# Patient Record
Sex: Female | Born: 1950 | Race: White | Hispanic: No | Marital: Married | Smoking: Former smoker
Health system: Southern US, Community
[De-identification: ages and names within clinical notes are randomized; demographics above are authoritative.]

## PROBLEM LIST (undated history)

## (undated) DIAGNOSIS — B029 Zoster without complications: Secondary | ICD-10-CM

## (undated) DIAGNOSIS — E119 Type 2 diabetes mellitus without complications: Secondary | ICD-10-CM

## (undated) DIAGNOSIS — B019 Varicella without complication: Secondary | ICD-10-CM

## (undated) DIAGNOSIS — R011 Cardiac murmur, unspecified: Secondary | ICD-10-CM

## (undated) DIAGNOSIS — E079 Disorder of thyroid, unspecified: Secondary | ICD-10-CM

## (undated) DIAGNOSIS — N390 Urinary tract infection, site not specified: Secondary | ICD-10-CM

## (undated) DIAGNOSIS — K635 Polyp of colon: Secondary | ICD-10-CM

## (undated) HISTORY — PX: TUBAL LIGATION: SHX77

## (undated) HISTORY — DX: Zoster without complications: B02.9

## (undated) HISTORY — DX: Urinary tract infection, site not specified: N39.0

## (undated) HISTORY — DX: Varicella without complication: B01.9

## (undated) HISTORY — DX: Type 2 diabetes mellitus without complications: E11.9

## (undated) HISTORY — DX: Cardiac murmur, unspecified: R01.1

## (undated) HISTORY — DX: Disorder of thyroid, unspecified: E07.9

## (undated) HISTORY — DX: Polyp of colon: K63.5

---

## 2011-08-23 HISTORY — PX: COLONOSCOPY: SHX174

## 2013-02-19 ENCOUNTER — Ambulatory Visit: Payer: Self-pay | Admitting: Internal Medicine

## 2013-03-04 ENCOUNTER — Encounter: Payer: Self-pay | Admitting: Internal Medicine

## 2013-03-04 ENCOUNTER — Ambulatory Visit (INDEPENDENT_AMBULATORY_CARE_PROVIDER_SITE_OTHER): Payer: BC Managed Care – PPO | Admitting: Internal Medicine

## 2013-03-04 VITALS — BP 122/70 | HR 72 | Temp 98.6°F | Resp 14 | Ht 66.0 in | Wt 153.8 lb

## 2013-03-04 DIAGNOSIS — R5381 Other malaise: Secondary | ICD-10-CM

## 2013-03-04 DIAGNOSIS — R918 Other nonspecific abnormal finding of lung field: Secondary | ICD-10-CM

## 2013-03-04 DIAGNOSIS — E785 Hyperlipidemia, unspecified: Secondary | ICD-10-CM | POA: Insufficient documentation

## 2013-03-04 DIAGNOSIS — IMO0002 Reserved for concepts with insufficient information to code with codable children: Secondary | ICD-10-CM

## 2013-03-04 DIAGNOSIS — F411 Generalized anxiety disorder: Secondary | ICD-10-CM | POA: Insufficient documentation

## 2013-03-04 DIAGNOSIS — R5383 Other fatigue: Secondary | ICD-10-CM

## 2013-03-04 DIAGNOSIS — G4700 Insomnia, unspecified: Secondary | ICD-10-CM

## 2013-03-04 DIAGNOSIS — K769 Liver disease, unspecified: Secondary | ICD-10-CM

## 2013-03-04 DIAGNOSIS — Z720 Tobacco use: Secondary | ICD-10-CM

## 2013-03-04 DIAGNOSIS — R160 Hepatomegaly, not elsewhere classified: Secondary | ICD-10-CM

## 2013-03-04 DIAGNOSIS — Z87891 Personal history of nicotine dependence: Secondary | ICD-10-CM | POA: Insufficient documentation

## 2013-03-04 DIAGNOSIS — Z1239 Encounter for other screening for malignant neoplasm of breast: Secondary | ICD-10-CM

## 2013-03-04 DIAGNOSIS — E119 Type 2 diabetes mellitus without complications: Secondary | ICD-10-CM

## 2013-03-04 DIAGNOSIS — Z87898 Personal history of other specified conditions: Secondary | ICD-10-CM | POA: Insufficient documentation

## 2013-03-04 DIAGNOSIS — E559 Vitamin D deficiency, unspecified: Secondary | ICD-10-CM

## 2013-03-04 DIAGNOSIS — E118 Type 2 diabetes mellitus with unspecified complications: Secondary | ICD-10-CM

## 2013-03-04 DIAGNOSIS — F17200 Nicotine dependence, unspecified, uncomplicated: Secondary | ICD-10-CM

## 2013-03-04 DIAGNOSIS — Z8709 Personal history of other diseases of the respiratory system: Secondary | ICD-10-CM

## 2013-03-04 LAB — CBC WITH DIFFERENTIAL/PLATELET
Basophils Absolute: 0 10*3/uL (ref 0.0–0.1)
Eosinophils Absolute: 0.3 10*3/uL (ref 0.0–0.7)
HCT: 42.8 % (ref 36.0–46.0)
Lymphs Abs: 2.3 10*3/uL (ref 0.7–4.0)
MCHC: 33.7 g/dL (ref 30.0–36.0)
MCV: 94.7 fl (ref 78.0–100.0)
Monocytes Absolute: 0.4 10*3/uL (ref 0.1–1.0)
Platelets: 264 10*3/uL (ref 150.0–400.0)
RDW: 13.5 % (ref 11.5–14.6)

## 2013-03-04 LAB — COMPREHENSIVE METABOLIC PANEL
Albumin: 4.1 g/dL (ref 3.5–5.2)
CO2: 27 mEq/L (ref 19–32)
Calcium: 9.4 mg/dL (ref 8.4–10.5)
GFR: 82.03 mL/min (ref >60.00)
Glucose, Bld: 108 mg/dL — ABNORMAL HIGH (ref 70–99)
Potassium: 4.2 mEq/L (ref 3.5–5.1)
Sodium: 140 mEq/L (ref 135–145)
Total Bilirubin: 0.5 mg/dL (ref 0.3–1.2)
Total Protein: 8.1 g/dL (ref 6.0–8.3)

## 2013-03-04 LAB — TSH: TSH: 2.98 u[IU]/mL (ref 0.35–5.50)

## 2013-03-04 LAB — LIPID PANEL
HDL: 68 mg/dL (ref >39.00)
Triglycerides: 135 mg/dL (ref 0.0–149.0)

## 2013-03-04 LAB — HEMOGLOBIN A1C: Hgb A1c MFr Bld: 6.3 % (ref 4.6–6.5)

## 2013-03-04 LAB — LDL CHOLESTEROL, DIRECT: Direct LDL: 186.8 mg/dL

## 2013-03-04 MED ORDER — ALPRAZOLAM 0.25 MG PO TABS: 0.2500 mg | ORAL_TABLET | Freq: Every evening | ORAL | Status: DC | PRN

## 2013-03-04 MED ORDER — ATORVASTATIN CALCIUM 20 MG PO TABS: 20.0000 mg | ORAL_TABLET | Freq: Every day | ORAL | Status: DC

## 2013-03-04 NOTE — Assessment & Plan Note (Addendum)
Well-controlled on diet alone by current hemoglobin A1c of 6.4  .  She is behind on eye exams and her foot exam is normal, as is he GFT amd urinalysis.  She has untreated hyperlipidemia  And tobacco abuse.  Will recommend crestor  Or lipitor.

## 2013-03-04 NOTE — Assessment & Plan Note (Signed)
Found previously on MRI of lungs, also noted in liver. . Repeat MRI is due.

## 2013-03-04 NOTE — Progress Notes (Signed)
Patient ID: Amanda Everett, female   DOB: 1950/09/16, 62 y.o.   MRN: [id number]   Patient Active Problem List   Diagnosis Date Noted  . H/O multiple pulmonary nodules 03/04/2013  . Diabetes mellitus type 2, diet-controlled 03/04/2013  . Tobacco abuse disorder 03/04/2013  . Other and unspecified hyperlipidemia 03/04/2013    Subjective:  CC:   Chief Complaint  Patient presents with  . Establish Care    HPI:   Amanda Everett is a 62 y.o. female who presents as a new patient to establish primary care with the chief complaint of  Anxiety.  She is worried  about her son Amanda Everett, who has a history of astrocytoma with prior radiation and surgeries due to recurrence and is due for repeat MRI this Friday (We discussed his prognosis,  She has not told her son that this prognosis is incurable if the tumor has returned.0   We discussed a neurosurgical referral to [place] for [facility].  Had a full  PE last year with mammogram and PAP smear. 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  No prior abnormals.  Last colonscopy was last ar and was told to follow up in 5 yr for polyp and FH in father.   History of diet controlled DM,  Managed with 30 lb wt loss and diet and 45 minutes of treadmill daily.  Has not exercised since she moved down her from [place] last year.  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  Tobacco abuse :  Trying to quit,  Using nicotine patches at work .  Had bronchitis in 2013 unresolving,  Had MRI of lungs which showed nodules in lungs and liver.  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  Had TB testing which was negative PPD  History of septicemia in 80's after a dental cleaning,  MV prolapse but no subsequent history of endocarditis.   5 vaginal deliveries. Incompetent cervix,  Some stress incontinence.  Wears a pad.   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 Chicken pox   . Diabetes mellitus without complication   . Heart murmur     MVP slight  . Colon polyps   . Thyroid disease   . UTI (urinary tract infection)     HX    Past Surgical History  Procedure Laterality Date  . Tubal ligation      Family History  Problem Relation Age of Onset  . Stroke Mother     During surgery  . Cancer Mother 25    hodgkins  . Alcohol abuse Father   . Cancer Father 46    colon cancer 19 prostate  . Arthritis Maternal Grandmother   . Heart disease Maternal Grandmother     History   Social History  . Marital Status: Married    Spouse Name: N/A    Number of Children: N/A  . Years of Education: N/A   Occupational History  . Not on file.   Social History Main Topics  . Smoking status: Current Some Day Smoker -- 0.50 packs/day    Types: Cigarettes  . Smokeless tobacco: Never Used  . Alcohol Use: 0.6 oz/week    1 Glasses of wine  per week     Comment: rarely  . Drug Use: No  . Sexually Active: No   Other Topics Concern  . Not on file   Social History Narrative  . No narrative on file    Allergies  Allergen Reactions  . Ivp Dye (Iodinated Diagnostic Agents) Anaphylaxis  . Tetracyclines & Related Anaphylaxis     Review of Systems:   Patient denies headache, fevers, malaise, unintentional weight loss, skin rash, eye pain, sinus congestion and sinus pain, sore throat, dysphagia,  hemoptysis , , wheezing, chest pain, palpitations, orthopnea, edema, abdominal pain, nausea, melena, diarrhea, constipation, flank pain, dysuria, hematuria, urinary  Frequency, nocturia, numbness, tingling, seizures,  Focal weakness, Loss of consciousness,  Tremor, insomnia, depression, and suicidal ideation.    Objective:  BP 122/70  Pulse 72  Temp(Src) 98.6 F (37 C) (Oral)  Resp 14  Ht 5\' 6"  (1.676 m)  Wt 153 lb 12 oz (69.741 kg)  BMI 24.83  kg/m2  SpO2 98%  General appearance: alert, cooperative and appears stated age Ears: normal TM's and external ear canals both ears Throat: lips, mucosa, and tongue normal; teeth and gums normal Neck: no adenopathy, no carotid bruit, supple, symmetrical, trachea midline and thyroid not enlarged, symmetric, no tenderness/mass/nodules Back: symmetric, no curvature. ROM normal. No CVA tenderness. Lungs: clear to auscultation bilaterally Heart: regular rate and rhythm, S1, S2 normal, no murmur, click, rub or gallop Abdomen: soft, non-tender; bowel sounds normal; no masses,  no organomegaly Pulses: 2+ and symmetric Skin: Skin color, texture, turgor normal. No rashes or lesions Lymph nodes: Cervical, supraclavicular, and axillary nodes normal. Foot exam:  Nails are well trimmed,  No callouses,  Sensation intact to microfilament  Assessment and Plan:  H/O multiple pulmonary nodules Found previously on MRI of lungs, also noted in liver. . Repeat MRI is due.   Diabetes mellitus type 2, diet-controlled Well-controlled on diet alone by current hemoglobin A1c of 6.4  .  She is behind on eye exams and her foot exam is normal, as is he GFT amd urinalysis.  She has untreated hyperlipidemia  And tobacco abuse.  Will recommend crestor  Or lipitor.   Tobacco abuse disorder She is trying to quit smoking.  With nicoderm.  Encouragement given.   Other and unspecified hyperlipidemia LDL is 180. And she has concurrent DM and tobacco abuse.  High risk for CAD events,  Will recommend statin therapy   Anxiety state, unspecified Secondary to the health and prognosis of her children,  She is using benadryl for insomnia.  Trial of alprazolam prn anxiety.    Updated Medication List Outpatient Encounter Prescriptions as of 03/04/2013  Medication Sig Dispense Refill  . ALPRAZolam (XANAX) 0.25 MG tablet Take 1 tablet (0.25 mg total) by mouth at bedtime as needed for sleep.  20 tablet  0  . atorvastatin (LIPITOR)  20 MG tablet Take 1 tablet (20 mg total) by mouth daily.  90 tablet  3  . diphenhydrAMINE (BENADRYL) 25 MG tablet Take 50 mg by mouth at bedtime as needed for itching.      . Probiotic Product (PROBIOTIC DAILY) CAPS Take 1 capsule by mouth daily.       No facility-administered encounter medications on file as of 03/04/2013.

## 2013-03-04 NOTE — Assessment & Plan Note (Signed)
She is trying to quit smoking.  With nicoderm.  Encouragement given.

## 2013-03-04 NOTE — Patient Instructions (Addendum)
Mammogram and MRIs to be set up by [facility]  We will call you or e mail your  bloodwork   This is  One version of a  "Low GI"  Diet:  It will still lower your blood sugars and allow you to lose 4 to 8  lbs  per month if you follow it carefully.  Your goal with exercise is a minimum of 30 minutes of aerobic exercise 5 days per week (Walking does not count once it becomes easy!)    All of the foods can be found at grocery stores and in bulk at [employer].  The Atkins protein bars and shakes are available in more varieties at Target, WalMart and Lowe's Foods.     7 AM Breakfast:  Choose from the following:  Low carbohydrate Protein  Shakes (I recommend the EAS AdvantEdge "Carb Control" shakes  Or the low carb shakes by Atkins.    2.5 carbs   Arnold's "Sandwhich Thin"toasted  w/ peanut butter (no jelly: about 20 net carbs  "Bagel Thin" with cream cheese and salmon: about 20 carbs   a scrambled egg/bacon/cheese burrito made with Mission's "carb balance" whole wheat tortilla  (about 10 net carbs )   Avoid cereal and bananas, oatmeal and cream of wheat and grits. They are loaded with carbohydrates!   10 AM: high protein snack  Protein bar by Atkins (the snack size, under 200 cal, usually < 6 net carbs).    A stick of cheese:  Around 1 carb,  100 cal     Dannon Light n Fit [place] Yogurt  (80 cal, 8 carbs)  Other so called "protein bars" and Greek yogurts tend to be loaded with carbohydrates.  Remember, in food advertising, the word "energy" is synonymous for " carbohydrate."  Lunch:   A Sandwich using the bread choices listed, Can use any  Eggs,  lunchmeat, grilled meat or canned tuna), avocado, regular mayo/mustard  and cheese.  A Salad using blue cheese, ranch,  Goddess or vinagrette,  No croutons or "confetti" and no "candied nuts" but regular nuts OK.   No pretzels or chips.  Pickles and miniature sweet peppers are a good low carb alternative that provide a "crunch"  The bread is the only source  of carbohydrate in a sandwich and  can be decreased by trying some of these alternatives to traditional loaf bread  Joseph's makes a pita bread and a flat bread that are 50 cal and 4 net carbs available at BJs and WalMart.  This can be toasted to use with hummous as well  Toufayan makes a low carb flatbread that's 100 cal and 9 net carbs available at [employer] and [employer] makes 2 sizes of  Low carb whole wheat tortilla  (The large one is 210 cal and 6 net carbs) Avoid "Low fat dressings, as well as Reyne Dumas and [address] dressings They are loaded with sugar!   3 PM/ Mid day  Snack:  Consider  1 ounce of  almonds, walnuts, pistachios, pecans, peanuts,  Macadamia nuts or a nut medley.  Avoid "granola"; the dried cranberries and raisins are loaded with carbohydrates. Mixed nuts as long as there are no raisins,  cranberries or dried fruit.     6 PM  Dinner:     Meat/fowl/fish with a green salad, and either broccoli, cauliflower, green beans, spinach, brussel sprouts or  Lima beans. DO NOT BREAD THE PROTEIN!!      There is a  low carb pasta by Dreamfield's that is acceptable and tastes great: only 5 digestible carbs/serving.( All grocery stores but BJs carry it )  Try Kai Levins Angelo's chicken piccata or chicken or eggplant parm over low carb pasta.(Lowes and BJs)   Clifton Custard Sanchez's "Carnitas" (pulled pork, no sauce,  0 carbs) or his beef pot roast to make a dinner burrito (at BJ's)  Pesto over low carb pasta (bj's sells a good quality pesto in the center refrigerated section of the deli   Whole wheat pasta is still full of digestible carbs and  Not as low in glycemic index as Dreamfield's.   Brown rice is still rice,  So skip the rice and noodles if you eat [place] or [place] (or at least limit to 1/2 cup)  9 PM snack :   Breyer's "low carb" fudgsicle or  ice cream bar (Carb Smart line), or  Weight Watcher's ice cream bar , or another "no sugar added" ice cream;  a serving of fresh  berries/cherries with whipped cream   Cheese or DANNON'S LlGHT N FIT GREEK YOGURT  Avoid bananas, pineapple, grapes  and watermelon on a regular basis because they are high in sugar.  THINK OF THEM AS DESSERT  Remember that snack Substitutions should be less than 10 NET carbs per serving and meals < 20 carbs. Remember to subtract fiber grams to get the "net carbs."

## 2013-03-04 NOTE — Assessment & Plan Note (Signed)
Secondary to the health and prognosis of her children,  She is using benadryl for insomnia.  Trial of alprazolam prn anxiety.

## 2013-03-04 NOTE — Assessment & Plan Note (Signed)
LDL is 180. And she has concurrent DM and tobacco abuse.  High risk for CAD events,  Will recommend statin therapy

## 2013-03-05 LAB — VITAMIN D 25 HYDROXY (VIT D DEFICIENCY, FRACTURES): Vit D, 25-Hydroxy: 53 ng/mL (ref 30–89)

## 2013-03-08 ENCOUNTER — Encounter: Payer: Self-pay | Admitting: *Deleted

## 2013-03-28 ENCOUNTER — Other Ambulatory Visit: Payer: Self-pay | Admitting: Internal Medicine

## 2013-03-28 ENCOUNTER — Telehealth: Payer: Self-pay | Admitting: Internal Medicine

## 2013-03-28 NOTE — Telephone Encounter (Signed)
Amanda Everett from [facility] states orders were placed for MRI of chest and MRI of liver.  The orders were entered incorrectly; she has corrected them.  Please sign new orders.

## 2013-03-28 NOTE — Telephone Encounter (Signed)
How am i supposed to do that?

## 2013-03-29 ENCOUNTER — Ambulatory Visit (HOSPITAL_COMMUNITY): Admission: RE | Admit: 2013-03-29 | Payer: BC Managed Care – PPO | Source: Ambulatory Visit

## 2013-04-03 ENCOUNTER — Other Ambulatory Visit: Payer: Self-pay | Admitting: Internal Medicine

## 2013-04-03 DIAGNOSIS — R160 Hepatomegaly, not elsewhere classified: Secondary | ICD-10-CM

## 2013-04-03 DIAGNOSIS — Z1239 Encounter for other screening for malignant neoplasm of breast: Secondary | ICD-10-CM

## 2013-04-03 DIAGNOSIS — R918 Other nonspecific abnormal finding of lung field: Secondary | ICD-10-CM

## 2013-04-05 ENCOUNTER — Other Ambulatory Visit: Payer: Self-pay | Admitting: Internal Medicine

## 2013-04-05 ENCOUNTER — Ambulatory Visit (HOSPITAL_COMMUNITY): Admission: RE | Admit: 2013-04-05 | Payer: BC Managed Care – PPO | Source: Ambulatory Visit

## 2013-04-05 ENCOUNTER — Ambulatory Visit (HOSPITAL_COMMUNITY)
Admission: RE | Admit: 2013-04-05 | Discharge: 2013-04-05 | Disposition: A | Discharge status: Home / Self Care | Payer: BC Managed Care – PPO | Source: Ambulatory Visit | Attending: Internal Medicine | Admitting: Internal Medicine

## 2013-04-05 DIAGNOSIS — K7689 Other specified diseases of liver: Secondary | ICD-10-CM | POA: Insufficient documentation

## 2013-04-05 DIAGNOSIS — R918 Other nonspecific abnormal finding of lung field: Secondary | ICD-10-CM

## 2013-04-05 DIAGNOSIS — D1803 Hemangioma of intra-abdominal structures: Secondary | ICD-10-CM | POA: Insufficient documentation

## 2013-04-05 MED ORDER — GADOBENATE DIMEGLUMINE 529 MG/ML IV SOLN
20.0000 mL | Freq: Once | INTRAVENOUS | Status: AC | PRN
  Administered 2013-04-05: 15 mL via INTRAVENOUS

## 2013-04-09 ENCOUNTER — Ambulatory Visit (HOSPITAL_COMMUNITY)
Admission: RE | Admit: 2013-04-09 | Discharge: 2013-04-09 | Disposition: A | Discharge status: Home / Self Care | Payer: BC Managed Care – PPO | Source: Ambulatory Visit | Attending: Internal Medicine | Admitting: Internal Medicine

## 2013-04-09 DIAGNOSIS — K7689 Other specified diseases of liver: Secondary | ICD-10-CM | POA: Insufficient documentation

## 2013-04-09 DIAGNOSIS — R911 Solitary pulmonary nodule: Secondary | ICD-10-CM | POA: Insufficient documentation

## 2013-04-09 DIAGNOSIS — R918 Other nonspecific abnormal finding of lung field: Secondary | ICD-10-CM

## 2014-02-28 ENCOUNTER — Telehealth: Payer: Self-pay | Admitting: Internal Medicine

## 2014-02-28 DIAGNOSIS — E785 Hyperlipidemia, unspecified: Secondary | ICD-10-CM

## 2014-02-28 NOTE — Telephone Encounter (Signed)
Yes, thank you.

## 2014-02-28 NOTE — Telephone Encounter (Signed)
Patient called requesting refill on Lipitor, has not been seen in a year, made lab appointment and Ov appointment may I fill for 30 days?

## 2014-03-03 MED ORDER — ATORVASTATIN CALCIUM 20 MG PO TABS: 20.0000 mg | ORAL_TABLET | Freq: Every day | ORAL | Status: DC

## 2014-03-03 NOTE — Telephone Encounter (Signed)
Refill sent.

## 2014-03-04 ENCOUNTER — Telehealth: Payer: Self-pay | Admitting: *Deleted

## 2014-03-04 ENCOUNTER — Other Ambulatory Visit: Payer: Self-pay | Admitting: Internal Medicine

## 2014-03-04 DIAGNOSIS — E119 Type 2 diabetes mellitus without complications: Secondary | ICD-10-CM

## 2014-03-04 DIAGNOSIS — R5383 Other fatigue: Secondary | ICD-10-CM

## 2014-03-04 DIAGNOSIS — R5381 Other malaise: Secondary | ICD-10-CM

## 2014-03-04 NOTE — Telephone Encounter (Signed)
Pt coming in tomorrow what labs and dx?  

## 2014-03-05 ENCOUNTER — Other Ambulatory Visit (INDEPENDENT_AMBULATORY_CARE_PROVIDER_SITE_OTHER): Payer: BC Managed Care – PPO

## 2014-03-05 DIAGNOSIS — R5383 Other fatigue: Secondary | ICD-10-CM

## 2014-03-05 DIAGNOSIS — E119 Type 2 diabetes mellitus without complications: Secondary | ICD-10-CM

## 2014-03-05 DIAGNOSIS — R5381 Other malaise: Secondary | ICD-10-CM

## 2014-03-05 LAB — COMPREHENSIVE METABOLIC PANEL
ALK PHOS: 40 U/L (ref 39–117)
ALT: 35 U/L (ref 0–35)
AST: 31 U/L (ref 0–37)
Albumin: 3.9 g/dL (ref 3.5–5.2)
BUN: 13 mg/dL (ref 6–23)
CHLORIDE: 104 meq/L (ref 96–112)
CO2: 27 mEq/L (ref 19–32)
Calcium: 9.1 mg/dL (ref 8.4–10.5)
Creatinine, Ser: 0.8 mg/dL (ref 0.4–1.2)
GFR: 83.02 mL/min (ref >60.00)
GLUCOSE: 105 mg/dL — AB (ref 70–99)
Potassium: 3.9 mEq/L (ref 3.5–5.1)
SODIUM: 139 meq/L (ref 135–145)
TOTAL PROTEIN: 6.9 g/dL (ref 6.0–8.3)
Total Bilirubin: 0.8 mg/dL (ref 0.2–1.2)

## 2014-03-05 LAB — MICROALBUMIN / CREATININE URINE RATIO
Creatinine,U: 46.2 mg/dL
Microalb Creat Ratio: 0.9 mg/g (ref 0.0–30.0)
Microalb, Ur: 0.4 mg/dL (ref 0.0–1.9)

## 2014-03-05 LAB — LIPID PANEL
Cholesterol: 188 mg/dL (ref 0–200)
HDL: 72.8 mg/dL (ref >39.00)
LDL CALC: 87 mg/dL (ref 0–99)
NONHDL: 115.2
TRIGLYCERIDES: 143 mg/dL (ref 0.0–149.0)
Total CHOL/HDL Ratio: 3
VLDL: 28.6 mg/dL (ref 0.0–40.0)

## 2014-03-05 LAB — TSH: TSH: 2.26 u[IU]/mL (ref 0.35–4.50)

## 2014-03-05 LAB — HEMOGLOBIN A1C: HEMOGLOBIN A1C: 6.1 % (ref 4.6–6.5)

## 2014-03-10 ENCOUNTER — Encounter: Payer: Self-pay | Admitting: *Deleted

## 2014-03-26 ENCOUNTER — Ambulatory Visit (INDEPENDENT_AMBULATORY_CARE_PROVIDER_SITE_OTHER): Payer: BC Managed Care – PPO | Admitting: Internal Medicine

## 2014-03-26 ENCOUNTER — Telehealth: Payer: Self-pay | Admitting: Internal Medicine

## 2014-03-26 ENCOUNTER — Encounter: Payer: Self-pay | Admitting: Internal Medicine

## 2014-03-26 VITALS — BP 146/84 | HR 76 | Temp 98.5°F | Resp 18 | Ht 65.0 in | Wt 168.8 lb

## 2014-03-26 DIAGNOSIS — Z720 Tobacco use: Secondary | ICD-10-CM

## 2014-03-26 DIAGNOSIS — E119 Type 2 diabetes mellitus without complications: Secondary | ICD-10-CM

## 2014-03-26 DIAGNOSIS — E785 Hyperlipidemia, unspecified: Secondary | ICD-10-CM

## 2014-03-26 DIAGNOSIS — F17200 Nicotine dependence, unspecified, uncomplicated: Secondary | ICD-10-CM

## 2014-03-26 DIAGNOSIS — F411 Generalized anxiety disorder: Secondary | ICD-10-CM

## 2014-03-26 DIAGNOSIS — L439 Lichen planus, unspecified: Secondary | ICD-10-CM

## 2014-03-26 MED ORDER — ALPRAZOLAM 0.5 MG PO TABS: 0.5000 mg | ORAL_TABLET | Freq: Every evening | ORAL | Status: DC | PRN

## 2014-03-26 NOTE — Patient Instructions (Addendum)
Here are the names of several well respected female therapists.  I urge you to make contact with one ASAP to discuss your son Delos Haring Nicole Kindred) and how to initiate help for him.   Lennon Alstrom    [phone]  [place] Karen [place]   [phone] [place] [phone]  Achilles Dunk  [phone]  [place] Lichen Planus Lichen planus is a skin problem that causes redness, itching, swelling, and sores. Some common areas affected are the:  Vulva and vagina.  Gums and inside of the mouth.  Esophagus.  Scalp.  Skin of the arms (especially wrists), legs, chest, back, and abdomen.  Fingernails or toenails. CAUSES The cause is not known. It could be an autoimmune illness or an allergy. An autoimmune illness is one where your body attacks itself. Lichen planus is not passed from one person to another (not contagious). It can last for a long time. Affected children usually have a history of other family members with lichen planus. SYMPTOMS  Itching, which can be severe.  The vaginal area may be red, sore, raw, or have a burning feeling.  There may be pain or bleeding during sex.  Scarring may cause the vagina to become short, narrow, or even closed up.  The skin may have small reddish or purplish, flat-topped, round or irregularly shaped bumps.  There may be redness or white patches on the gums or tongue.  The nails may become thin, rough, or have ridges in them.  The eyes can rarely also be involved with pain, redness, or scarring. DIAGNOSIS Your caregiver will look for skin changes, changes inside your mouth, or vaginal discharge. Sometimes, a tissue sample (biopsy) may be sent for testing. TREATMENT  Your caregiver may order a cream (topical steroid) to be put on the sores.  You may be given medicine to take by mouth.  You may be treated by exposure to ultraviolet light.  Sores in the mouth may be treated with lozenges that you suck on  like a cough drop.  If the vagina becomes too tight, you may be taught how to use a dilator to keep it open. HOME CARE INSTRUCTIONS  Only take over-the-counter or prescription medicines as directed by your caregiver.  Keep the vaginal area as clean and dry as possible. SEEK MEDICAL CARE IF: You develop increasing pain, swelling, or redness. Document Released: 12/30/2010 Document Revised: 10/31/2011 Document Reviewed: 12/30/2010 [facility] Patient Information 2015 [place], [place]. This information is not intended to replace advice given to you by your health care provider. Make sure you discuss any questions you have with your health care provider.    Bipolar Disorder Bipolar disorder is a mental illness. The term bipolar disorder actually is used to describe a group of disorders that all share varying degrees of emotional highs and lows that can interfere with daily functioning, such as work, school, or relationships. Bipolar disorder also can lead to drug abuse, hospitalization, and suicide. The emotional highs of bipolar disorder are periods of elation or irritability and high energy. These highs can range from a mild form (hypomania) to a severe form (mania). People experiencing episodes of hypomania may appear energetic, excitable, and highly productive. People experiencing mania may behave impulsively or erratically. They often make poor decisions. They may have difficulty sleeping. The most severe episodes of mania can involve having very distorted beliefs or perceptions about the world and seeing or hearing things that are not real (psychotic delusions and hallucinations).  The  emotional lows of bipolar disorder (depression) also can range from mild to severe. Severe episodes of bipolar depression can involve psychotic delusions and hallucinations. Sometimes people with bipolar disorder experience a state of mixed mood. Symptoms of hypomania or mania and depression are both present during  this mixed-mood episode. SIGNS AND SYMPTOMS There are signs and symptoms of the episodes of hypomania and mania as well as the episodes of depression. The signs and symptoms of hypomania and mania are similar but vary in severity. They include:  Inflated self-esteem or feeling of increased self-confidence.  Decreased need for sleep.  Unusual talkativeness (rapid or pressured speech) or the feeling of a need to keep talking.  Sensation of racing thoughts or constant talking, with quick shifts between topics that may or may not be related (flight of ideas).  Decreased ability to focus or concentrate.  Increased purposeful activity, such as work, studies, or social activity, or nonproductive activity, such as pacing, squirming and fidgeting, or finger and toe tapping.  Impulsive behavior and use of poor judgment, resulting in high-risk activities, such as having unprotected sex or spending excessive amounts of money. Signs and symptoms of depression include the following:   Feelings of sadness, hopelessness, or helplessness.  Frequent or uncontrollable episodes of crying.  Lack of feeling anything or caring about anything.  Difficulty sleeping or sleeping too much.  Inability to enjoy the things you used to enjoy.   Desire to be alone all the time.   Feelings of guilt or worthlessness.  Lack of energy or motivation.   Difficulty concentrating, remembering, or making decisions.  Change in appetite or weight beyond normal fluctuations.  Thoughts of death or the desire to harm yourself. DIAGNOSIS  Bipolar disorder is diagnosed through an assessment by your caregiver. Your caregiver will ask questions about your emotional episodes. There are two main types of bipolar disorder. People with type I bipolar disorder have manic episodes with or without depressive episodes. People with type II bipolar disorder have hypomanic episodes and major depressive episodes, which are more  serious than mild depression. The type of bipolar disorder you have can make an important difference in how your illness is monitored and treated. Your caregiver may ask questions about your medical history and use of alcohol or drugs, including prescription medication. Certain medical conditions and substances also can cause emotional highs and lows that resemble bipolar disorder (secondary bipolar disorder).  TREATMENT  Bipolar disorder is a long-term illness. It is best controlled with continuous treatment rather than treatment only when symptoms occur. The following treatments can be prescribed for bipolar disorders:  Medication--Medication can be prescribed by a doctor that is an expert in treating mental disorders (psychiatrists). Medications called mood stabilizers are usually prescribed to help control the illness. Other medications are sometimes added if symptoms of mania, depression, or psychotic delusions and hallucinations occur despite the use of a mood stabilizer.  Talk therapy--Some forms of talk therapy are helpful in providing support, education, and guidance. A combination of medication and talk therapy is best for managing the disorder over time. A procedure in which electricity is applied to your brain through your scalp (electroconvulsive therapy) is used in cases of severe mania when medication and talk therapy do not work or work too slowly. Document Released: 11/14/2000 Document Revised: 12/03/2012 Document Reviewed: 09/03/2012 [facility] Patient Information 2015 [place], [place]. This information is not intended to replace advice given to you by your health care provider. Make sure you discuss any questions you  have with your health care provider.  

## 2014-03-26 NOTE — Assessment & Plan Note (Addendum)
She quit for a few months but resumed due to stress of family events. Centered around [place] 's psychiatric illness.  Daughter [name] is getting married .  Risks of continued tobacco use were discussed. She is not currently interested in tobacco cessation.

## 2014-03-26 NOTE — Progress Notes (Signed)
Pre-visit discussion using our clinic review tool. No additional management support is needed unless otherwise documented below in the visit note.  

## 2014-03-26 NOTE — Assessment & Plan Note (Addendum)
Affecting gums,  Skin on upper arms, and legs.  Advise to continue use of ointment and sun exposure

## 2014-03-26 NOTE — Telephone Encounter (Signed)
Relevant patient education mailed to patient.  
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
## 2014-03-28 ENCOUNTER — Encounter: Payer: Self-pay | Admitting: Internal Medicine

## 2014-03-28 NOTE — Assessment & Plan Note (Addendum)
Secondary to escalating family conflicts. Adding alprazolam for anxiety causing insomnia.  She was strongly urged to seek counselling with husband so together they can face their son and get either get  him the help that he needs , or distance themselves from him.

## 2014-03-28 NOTE — Assessment & Plan Note (Signed)
Well-controlled on diet alone by current hemoglobin A1c  .  She is behind on eye exams and her foot exam is normal.  She has untreated hyperlipidemia  And tobacco abuse.  Will recommend crestor  Or lipitor.   Lab Results  Component Value Date   HGBA1C 6.1 03/05/2014   Lab Results  Component Value Date   MICROALBUR 0.4 03/05/2014   Lab Results  Component Value Date   CHOL 188 03/05/2014   HDL 72.80 03/05/2014   LDLCALC 87 03/05/2014   LDLDIRECT 186.8 03/04/2013   TRIG 143.0 03/05/2014   CHOLHDL 3 03/05/2014

## 2014-03-28 NOTE — Assessment & Plan Note (Signed)
LDL and triglycerides are at goal on current medications. She  has no side effects and liver enzymes are normal. No changes today  Lab Results  Component Value Date   CHOL 188 03/05/2014   HDL 72.80 03/05/2014   LDLCALC 87 03/05/2014   LDLDIRECT 186.8 03/04/2013   TRIG 143.0 03/05/2014   CHOLHDL 3 03/05/2014   Lab Results  Component Value Date   ALT 35 03/05/2014   AST 31 03/05/2014   ALKPHOS 40 03/05/2014   BILITOT 0.8 03/05/2014

## 2014-04-07 ENCOUNTER — Other Ambulatory Visit: Payer: Self-pay | Admitting: Internal Medicine

## 2014-04-23 ENCOUNTER — Emergency Department: Payer: Self-pay | Admitting: Emergency Medicine

## 2014-06-26 ENCOUNTER — Telehealth: Payer: Self-pay | Admitting: Internal Medicine

## 2014-06-26 DIAGNOSIS — M25551 Pain in right hip: Secondary | ICD-10-CM

## 2014-06-26 MED ORDER — TRAMADOL HCL 50 MG PO TABS: 50.0000 mg | ORAL_TABLET | Freq: Three times a day (TID) | ORAL | Status: DC | PRN

## 2014-06-26 NOTE — Telephone Encounter (Signed)
Patient stated that she cannot remember anything hurting in her right hip. Worse with sitting, patient did state she fractured her coccyx during labor with one of her children and this is what it compares to patient has an appointment for CPE on 07/03/14, Patient stated she is taking Aleve but not helping and it is nauseating her.

## 2014-06-26 NOTE — Telephone Encounter (Signed)
I cannot see her before Nov 12th,  But I will order plain films of hip [facility]),  Which I would like her to get before her visit, Stop the the aleve bc it is giving her gastritis.  nd start tylenol 500 mg every 6 hours and tramadol 50 mg  Up to 4 daily.,

## 2014-06-26 NOTE — Telephone Encounter (Signed)
Pt called to request an appt. Pt is having hip pain. Please advise where to add pt.msn

## 2014-06-26 NOTE — Telephone Encounter (Signed)
Notified patient and faxed script to [place].

## 2014-07-03 ENCOUNTER — Ambulatory Visit (INDEPENDENT_AMBULATORY_CARE_PROVIDER_SITE_OTHER): Payer: BC Managed Care – PPO | Admitting: Internal Medicine

## 2014-07-03 ENCOUNTER — Encounter: Payer: Self-pay | Admitting: Internal Medicine

## 2014-07-03 ENCOUNTER — Ambulatory Visit (INDEPENDENT_AMBULATORY_CARE_PROVIDER_SITE_OTHER)
Admission: RE | Admit: 2014-07-03 | Discharge: 2014-07-03 | Disposition: A | Discharge status: Home / Self Care | Payer: BC Managed Care – PPO | Source: Ambulatory Visit | Attending: Internal Medicine | Admitting: Internal Medicine

## 2014-07-03 ENCOUNTER — Encounter: Payer: Self-pay | Admitting: *Deleted

## 2014-07-03 VITALS — BP 148/80 | HR 71 | Temp 97.5°F | Resp 16 | Ht 64.75 in | Wt 168.8 lb

## 2014-07-03 DIAGNOSIS — I8393 Asymptomatic varicose veins of bilateral lower extremities: Secondary | ICD-10-CM

## 2014-07-03 DIAGNOSIS — Z720 Tobacco use: Secondary | ICD-10-CM

## 2014-07-03 DIAGNOSIS — E559 Vitamin D deficiency, unspecified: Secondary | ICD-10-CM

## 2014-07-03 DIAGNOSIS — M5489 Other dorsalgia: Secondary | ICD-10-CM

## 2014-07-03 DIAGNOSIS — Z0000 Encounter for general adult medical examination without abnormal findings: Secondary | ICD-10-CM

## 2014-07-03 DIAGNOSIS — Z23 Encounter for immunization: Secondary | ICD-10-CM

## 2014-07-03 DIAGNOSIS — E119 Type 2 diabetes mellitus without complications: Secondary | ICD-10-CM

## 2014-07-03 DIAGNOSIS — Z1239 Encounter for other screening for malignant neoplasm of breast: Secondary | ICD-10-CM

## 2014-07-03 DIAGNOSIS — M549 Dorsalgia, unspecified: Secondary | ICD-10-CM

## 2014-07-03 DIAGNOSIS — Z1159 Encounter for screening for other viral diseases: Secondary | ICD-10-CM

## 2014-07-03 MED ORDER — DIAZEPAM 10 MG PO TABS: 10.0000 mg | ORAL_TABLET | Freq: Two times a day (BID) | ORAL | Status: DC | PRN

## 2014-07-03 MED ORDER — CLOTRIMAZOLE 1 % EX CREA: 1.0000 "application " | TOPICAL_CREAM | Freq: Two times a day (BID) | CUTANEOUS | Status: DC

## 2014-07-03 NOTE — Patient Instructions (Signed)
You had your annual  wellness exam today.  We will repeat your PAP smear in 2016, sooner if needed   We will schedule your mammogram in December .   You received the influenza vaccine today.    Please make an appt in January for fasting labs at your earliest convenience.    I am prescribing valium as a muscle relaxer ,  Start with 1/2 tablet every 8 hours as needed   Get your x rays when you are able to, at the [facility] office  Health Maintenance Adopting a healthy lifestyle and getting preventive care can go a long way to promote health and wellness. Talk with your health care provider about what schedule of regular examinations is right for you. This is a good chance for you to check in with your provider about disease prevention and staying healthy. In between checkups, there are plenty of things you can do on your own. Experts have done a lot of research about which lifestyle changes and preventive measures are most likely to keep you healthy. Ask your health care provider for more information. WEIGHT AND DIET  Eat a healthy diet  Be sure to include plenty of vegetables, fruits, low-fat dairy products, and lean protein.  Do not eat a lot of foods high in solid fats, added sugars, or salt.  Get regular exercise. This is one of the most important things you can do for your health.  Most adults should exercise for at least 150 minutes each week. The exercise should increase your heart rate and make you sweat (moderate-intensity exercise).  Most adults should also do strengthening exercises at least twice a week. This is in addition to the moderate-intensity exercise.  Maintain a healthy weight  Body mass index (BMI) is a measurement that can be used to identify possible weight problems. It estimates body fat based on height and weight. Your health care provider can help determine your BMI and help you achieve or maintain a healthy weight.  For females 90 years of age and  older:   A BMI below 18.5 is considered underweight.  A BMI of 18.5 to 24.9 is normal.  A BMI of 25 to 29.9 is considered overweight.  A BMI of 30 and above is considered obese.  Watch levels of cholesterol and blood lipids  You should start having your blood tested for lipids and cholesterol at 63 years of age, then have this test every 5 years.  You may need to have your cholesterol levels checked more often if:  Your lipid or cholesterol levels are high.  You are older than 63 years of age.  You are at high risk for heart disease.  CANCER SCREENING   Lung Cancer  Lung cancer screening is recommended for adults 51-58 years old who are at high risk for lung cancer because of a history of smoking.  A yearly low-dose CT scan of the lungs is recommended for people who:  Currently smoke.  Have quit within the past 15 years.  Have at least a 30-pack-year history of smoking. A pack year is smoking an average of one pack of cigarettes a day for 1 year.  Yearly screening should continue until it has been 15 years since you quit.  Yearly screening should stop if you develop a health problem that would prevent you from having lung cancer treatment.  Breast Cancer  Practice breast self-awareness. This means understanding how your breasts normally appear and feel.  It also means  doing regular breast self-exams. Let your health care provider know about any changes, no matter how small.  If you are in your 20s or 30s, you should have a clinical breast exam (CBE) by a health care provider every 1-3 years as part of a regular health exam.  If you are 42 or older, have a CBE every year. Also consider having a breast X-ray (mammogram) every year.  If you have a family history of breast cancer, talk to your health care provider about genetic screening.  If you are at high risk for breast cancer, talk to your health care provider about having an MRI and a mammogram every  year.  Breast cancer gene (BRCA) assessment is recommended for women who have family members with BRCA-related cancers. BRCA-related cancers include:  Breast.  Ovarian.  Tubal.  Peritoneal cancers.  Results of the assessment will determine the need for genetic counseling and BRCA1 and BRCA2 testing. Cervical Cancer Routine pelvic examinations to screen for cervical cancer are no longer recommended for nonpregnant women who are considered low risk for cancer of the pelvic organs (ovaries, uterus, and vagina) and who do not have symptoms. A pelvic examination may be necessary if you have symptoms including those associated with pelvic infections. Ask your health care provider if a screening pelvic exam is right for you.   The Pap test is the screening test for cervical cancer for women who are considered at risk.  If you had a hysterectomy for a problem that was not cancer or a condition that could lead to cancer, then you no longer need Pap tests.  If you are older than 65 years, and you have had normal Pap tests for the past 10 years, you no longer need to have Pap tests.  If you have had past treatment for cervical cancer or a condition that could lead to cancer, you need Pap tests and screening for cancer for at least 20 years after your treatment.  If you no longer get a Pap test, assess your risk factors if they change (such as having a new sexual partner). This can affect whether you should start being screened again.  Some women have medical problems that increase their chance of getting cervical cancer. If this is the case for you, your health care provider may recommend more frequent screening and Pap tests.  The human papillomavirus (HPV) test is another test that may be used for cervical cancer screening. The HPV test looks for the virus that can cause cell changes in the cervix. The cells collected during the Pap test can be tested for HPV.  The HPV test can be used to screen  women 74 years of age and older. Getting tested for HPV can extend the interval between normal Pap tests from three to five years.  An HPV test also should be used to screen women of any age who have unclear Pap test results.  After 63 years of age, women should have HPV testing as often as Pap tests.  Colorectal Cancer  This type of cancer can be detected and often prevented.  Routine colorectal cancer screening usually begins at 63 years of age and continues through 63 years of age.  Your health care provider may recommend screening at an earlier age if you have risk factors for colon cancer.  Your health care provider may also recommend using home test kits to check for hidden blood in the stool.  A small camera at the end of a  tube can be used to examine your colon directly (sigmoidoscopy or colonoscopy). This is done to check for the earliest forms of colorectal cancer.  Routine screening usually begins at age 72.  Direct examination of the colon should be repeated every 5-10 years through 63 years of age. However, you may need to be screened more often if early forms of precancerous polyps or small growths are found. Skin Cancer  Check your skin from head to toe regularly.  Tell your health care provider about any new moles or changes in moles, especially if there is a change in a mole's shape or color.  Also tell your health care provider if you have a mole that is larger than the size of a pencil eraser.  Always use sunscreen. Apply sunscreen liberally and repeatedly throughout the day.  Protect yourself by wearing long sleeves, pants, a wide-brimmed hat, and sunglasses whenever you are outside. HEART DISEASE, DIABETES, AND HIGH BLOOD PRESSURE   Have your blood pressure checked at least every 1-2 years. High blood pressure causes heart disease and increases the risk of stroke.  If you are between 25 years and 23 years old, ask your health care provider if you should take  aspirin to prevent strokes.  Have regular diabetes screenings. This involves taking a blood sample to check your fasting blood sugar level.  If you are at a normal weight and have a low risk for diabetes, have this test once every three years after 63 years of age.  If you are overweight and have a high risk for diabetes, consider being tested at a younger age or more often. PREVENTING INFECTION  Hepatitis B  If you have a higher risk for hepatitis B, you should be screened for this virus. You are considered at high risk for hepatitis B if:  You were born in a country where hepatitis B is common. Ask your health care provider which countries are considered high risk.  Your parents were born in a high-risk country, and you have not been immunized against hepatitis B (hepatitis B vaccine).  You have HIV or AIDS.  You use needles to inject street drugs.  You live with someone who has hepatitis B.  You have had sex with someone who has hepatitis B.  You get hemodialysis treatment.  You take certain medicines for conditions, including cancer, organ transplantation, and autoimmune conditions. Hepatitis C  Blood testing is recommended for:  Everyone born from [birth year] through 1965.  Anyone with known risk factors for hepatitis C. Sexually transmitted infections (STIs)  You should be screened for sexually transmitted infections (STIs) including gonorrhea and chlamydia if:  You are sexually active and are younger than 63 years of age.  You are older than 63 years of age and your health care provider tells you that you are at risk for this type of infection.  Your sexual activity has changed since you were last screened and you are at an increased risk for chlamydia or gonorrhea. Ask your health care provider if you are at risk.  If you do not have HIV, but are at risk, it may be recommended that you take a prescription medicine daily to prevent HIV infection. This is called  pre-exposure prophylaxis (PrEP). You are considered at risk if:  You are sexually active and do not regularly use condoms or know the HIV status of your partner(s).  You take drugs by injection.  You are sexually active with a partner who has HIV. Talk with  your health care provider about whether you are at high risk of being infected with HIV. If you choose to begin PrEP, you should first be tested for HIV. You should then be tested every 3 months for as long as you are taking PrEP.  PREGNANCY   If you are premenopausal and you may become pregnant, ask your health care provider about preconception counseling.  If you may become pregnant, take 400 to 800 micrograms (mcg) of folic acid every day.  If you want to prevent pregnancy, talk to your health care provider about birth control (contraception). OSTEOPOROSIS AND MENOPAUSE   Osteoporosis is a disease in which the bones lose minerals and strength with aging. This can result in serious bone fractures. Your risk for osteoporosis can be identified using a bone density scan.  If you are 53 years of age or older, or if you are at risk for osteoporosis and fractures, ask your health care provider if you should be screened.  Ask your health care provider whether you should take a calcium or vitamin D supplement to lower your risk for osteoporosis.  Menopause may have certain physical symptoms and risks.  Hormone replacement therapy may reduce some of these symptoms and risks. Talk to your health care provider about whether hormone replacement therapy is right for you.  HOME CARE INSTRUCTIONS   Schedule regular health, dental, and eye exams.  Stay current with your immunizations.   Do not use any tobacco products including cigarettes, chewing tobacco, or electronic cigarettes.  If you are pregnant, do not drink alcohol.  If you are breastfeeding, limit how much and how often you drink alcohol.  Limit alcohol intake to no more than 1  drink per day for nonpregnant women. One drink equals 12 ounces of beer, 5 ounces of wine, or 1 ounces of hard liquor.  Do not use street drugs.  Do not share needles.  Ask your health care provider for help if you need support or information about quitting drugs.  Tell your health care provider if you often feel depressed.  Tell your health care provider if you have ever been abused or do not feel safe at home. Document Released: 02/21/2011 Document Revised: 12/23/2013 Document Reviewed: 07/10/2013 [facility] Patient Information 2015 [place], [place]. This information is not intended to replace advice given to you by your health care provider. Make sure you discuss any questions you have with your health care provider.

## 2014-07-03 NOTE — Progress Notes (Signed)
Pre-visit discussion using our clinic review tool. No additional management support is needed unless otherwise documented below in the visit note.  

## 2014-07-03 NOTE — Progress Notes (Signed)
Patient ID: Amanda Everett, female   DOB: 06/30/1951, 63 y.o.   MRN: [id number]    Subjective:     Amanda Everett is a 63 y.o. female and is here for a comprehensive physical exam. The patient reports continued anxiety secondary to family stressors.  BACK PAIN.  "im better nw"  Started last Tuesday after climbing into husband's truck . Pain is not radiating,  Right hip ,  Occasionally the groin but not down leg   Son Amanda Everett is dealing with recurrent depression. Unemployed,  Living with daughter because of [place].   Needs to come in.   History   Social History  . Marital Status: Married    Spouse Name: N/A    Number of Children: N/A  . Years of Education: N/A   Occupational History  . Not on file.   Social History Main Topics  . Smoking status: Current Some Day Smoker -- 0.50 packs/day    Types: Cigarettes  . Smokeless tobacco: Never Used  . Alcohol Use: 0.6 oz/week    1 Glasses of wine per week     Comment: rarely  . Drug Use: No  . Sexual Activity: No   Other Topics Concern  . Not on file   Social History Narrative   Health Maintenance  Topic Date Due  . PNEUMOCOCCAL POLYSACCHARIDE VACCINE (1) 06/07/1953  . OPHTHALMOLOGY EXAM  06/07/1961  . ZOSTAVAX  06/08/2011  . FOOT EXAM  03/04/2014  . HEMOGLOBIN A1C  09/05/2014  . PAP SMEAR  02/03/2015  . URINE MICROALBUMIN  03/06/2015  . INFLUENZA VACCINE  03/23/2015  . MAMMOGRAM  05/15/2015  . COLONOSCOPY  03/04/2022  . TETANUS/TDAP  04/23/2024    The following portions of the patient's history were reviewed and updated as appropriate: allergies, current medications, past family history, past medical history, past social history, past surgical history and problem list.  Review of Systems A comprehensive review of systems was negative.   Objective:   BP 148/80 mmHg  Pulse 71  Temp(Src) 97.5 F (36.4 C) (Oral)  Resp 16  Ht 5' 4.75" (1.645 m)  Wt 168 lb 12 oz (76.544 kg)  BMI 28.29 kg/m2  SpO2 98%  General  appearance: alert, cooperative and appears stated age Head: Normocephalic, without obvious abnormality, atraumatic Eyes: conjunctivae/corneas clear. PERRL, EOM's intact. Fundi benign. Ears: normal TM's and external ear canals both ears Nose: Nares normal. Septum midline. Mucosa normal. No drainage or sinus tenderness. Throat: lips, mucosa, and tongue normal; teeth and gums normal Neck: no adenopathy, no carotid bruit, no JVD, supple, symmetrical, trachea midline and thyroid not enlarged, symmetric, no tenderness/mass/nodules Lungs: clear to auscultation bilaterally Breasts: normal appearance, no masses or tenderness Heart: regular rate and rhythm, S1, S2 normal, no murmur, click, rub or gallop Abdomen: soft, non-tender; bowel sounds normal; no masses,  no organomegaly Extremities: extremities normal, atraumatic, no cyanosis or edema Pulses: 2+ and symmetric Skin: Skin color, texture, turgor normal. No rashes or lesions Neurologic: Alert and oriented X 3, normal strength and tone. Normal symmetric reflexes. Normal coordination and gait.   ,    Assessment and Plan:     Diabetes mellitus type 2, diet-controlled Well-controlled on diet alone by prior hemoglobin A1c  .  She is behind on eye exams and her foot exam is normal.  She has been taking Lipitor since July and will return for fasting labs.  Lab Results  Component Value Date   HGBA1C 6.1 03/05/2014   Lab Results  Component Value Date  MICROALBUR 0.4 03/05/2014   Lab Results  Component Value Date   CHOL 188 03/05/2014   HDL 72.80 03/05/2014   LDLCALC 87 03/05/2014   LDLDIRECT 186.8 03/04/2013   TRIG 143.0 03/05/2014   CHOLHDL 3 03/05/2014       Back pain without sciatica P;lain films, muscle relaxer and anti inflammatory  Tobacco abuse disorder Smoking cessation instruction/counseling given:  counseled patient on the dangers of tobacco use, advised patient to stop smoking, and reviewed strategies to maximize  success  Encounter for preventive health examination  Annual wellness  exam was done as well as a comprehensive physical exam and management of acute and chronic conditions .  During the course of the visit the patient was educated and counseled about appropriate screening and preventive services including :  diabetes screening, lipid analysis with projected  10 year  risk for CAD , nutrition counseling, colorectal cancer screening, and recommended immunizations.  Printed recommendations for health maintenance screenings was given.    Updated Medication List Outpatient Encounter Prescriptions as of 07/03/2014  Medication Sig  . ALPRAZolam (XANAX) 0.5 MG tablet Take 1 tablet (0.5 mg total) by mouth at bedtime as needed for sleep.  Marland Kitchen atorvastatin (LIPITOR) 20 MG tablet TAKE 1 TABLET BY MOUTH EVERY DAY  . clotrimazole (LOTRIMIN) 1 % cream Apply 1 application topically 2 (two) times daily.  . diazepam (VALIUM) 10 MG tablet Take 1 tablet (10 mg total) by mouth every 12 (twelve) hours as needed for anxiety.  . diphenhydrAMINE (BENADRYL) 25 MG tablet Take 50 mg by mouth at bedtime as needed for itching.  . fluocinonide cream (LIDEX) 2.70 % Apply 1 application topically 2 (two) times daily.  . fluocinonide gel (LIDEX) 6.23 % Apply 1 application topically 2 (two) times daily.  . Probiotic Product (PROBIOTIC DAILY) CAPS Take 1 capsule by mouth daily.  . traMADol (ULTRAM) 50 MG tablet Take 1 tablet (50 mg total) by mouth every 8 (eight) hours as needed.  . triamcinolone cream (KENALOG) 0.1 % Apply 1 application topically 2 (two) times daily.  . [DISCONTINUED] clotrimazole (LOTRIMIN) 1 % cream Apply 1 application topically 2 (two) times daily.

## 2014-07-05 DIAGNOSIS — Z0000 Encounter for general adult medical examination without abnormal findings: Secondary | ICD-10-CM | POA: Insufficient documentation

## 2014-07-05 NOTE — Assessment & Plan Note (Signed)

## 2014-07-05 NOTE — Assessment & Plan Note (Signed)
Smoking cessation instruction/counseling given:  counseled patient on the dangers of tobacco use, advised patient to stop smoking, and reviewed strategies to maximize success 

## 2014-07-05 NOTE — Assessment & Plan Note (Addendum)
Well-controlled on diet alone by prior hemoglobin A1c  .  She is behind on eye exams and her foot exam is normal.  She has been taking Lipitor since July and will return for fasting labs.  Lab Results  Component Value Date   HGBA1C 6.1 03/05/2014   Lab Results  Component Value Date   MICROALBUR 0.4 03/05/2014   Lab Results  Component Value Date   CHOL 188 03/05/2014   HDL 72.80 03/05/2014   LDLCALC 87 03/05/2014   LDLDIRECT 186.8 03/04/2013   TRIG 143.0 03/05/2014   CHOLHDL 3 03/05/2014

## 2014-07-05 NOTE — Assessment & Plan Note (Signed)
P;lain films, muscle relaxer and anti inflammatory

## 2014-07-08 ENCOUNTER — Other Ambulatory Visit: Payer: Self-pay | Admitting: Internal Medicine

## 2014-07-21 ENCOUNTER — Encounter: Payer: Self-pay | Admitting: Internal Medicine

## 2014-07-23 LAB — HM MAMMOGRAPHY: HM Mammogram: NEGATIVE

## 2014-07-29 ENCOUNTER — Encounter: Payer: Self-pay | Admitting: *Deleted

## 2014-08-14 ENCOUNTER — Encounter: Payer: Self-pay | Admitting: Internal Medicine

## 2014-08-28 ENCOUNTER — Other Ambulatory Visit (INDEPENDENT_AMBULATORY_CARE_PROVIDER_SITE_OTHER): Payer: BLUE CROSS/BLUE SHIELD

## 2014-08-28 DIAGNOSIS — E559 Vitamin D deficiency, unspecified: Secondary | ICD-10-CM

## 2014-08-28 DIAGNOSIS — Z1159 Encounter for screening for other viral diseases: Secondary | ICD-10-CM

## 2014-08-28 DIAGNOSIS — E119 Type 2 diabetes mellitus without complications: Secondary | ICD-10-CM

## 2014-08-28 LAB — MICROALBUMIN / CREATININE URINE RATIO
Creatinine,U: 9.7 mg/dL
Microalb Creat Ratio: 2.1 mg/g (ref 0.0–30.0)
Microalb, Ur: 0.2 mg/dL (ref 0.0–1.9)

## 2014-08-28 LAB — COMPREHENSIVE METABOLIC PANEL
ALT: 39 U/L — ABNORMAL HIGH (ref 0–35)
AST: 35 U/L (ref 0–37)
Albumin: 4.1 g/dL (ref 3.5–5.2)
Alkaline Phosphatase: 47 U/L (ref 39–117)
BILIRUBIN TOTAL: 1 mg/dL (ref 0.2–1.2)
BUN: 16 mg/dL (ref 6–23)
CALCIUM: 9 mg/dL (ref 8.4–10.5)
CO2: 26 meq/L (ref 19–32)
Chloride: 106 mEq/L (ref 96–112)
Creatinine, Ser: 0.8 mg/dL (ref 0.4–1.2)
GFR: 76.94 mL/min (ref >60.00)
Glucose, Bld: 97 mg/dL (ref 70–99)
Potassium: 4.1 mEq/L (ref 3.5–5.1)
Sodium: 138 mEq/L (ref 135–145)
TOTAL PROTEIN: 7.4 g/dL (ref 6.0–8.3)

## 2014-08-28 LAB — LIPID PANEL
CHOL/HDL RATIO: 3
Cholesterol: 199 mg/dL (ref 0–200)
HDL: 76.7 mg/dL (ref >39.00)
LDL Cholesterol: 100 mg/dL — ABNORMAL HIGH (ref 0–99)
NONHDL: 122.3
Triglycerides: 111 mg/dL (ref 0.0–149.0)
VLDL: 22.2 mg/dL (ref 0.0–40.0)

## 2014-08-28 LAB — VITAMIN D 25 HYDROXY (VIT D DEFICIENCY, FRACTURES): VITD: 28.08 ng/mL — ABNORMAL LOW (ref 30.00–100.00)

## 2014-08-28 LAB — HEMOGLOBIN A1C: Hgb A1c MFr Bld: 6.3 % (ref 4.6–6.5)

## 2014-08-29 ENCOUNTER — Encounter: Payer: Self-pay | Admitting: *Deleted

## 2014-08-29 LAB — HEPATITIS C ANTIBODY: HCV AB: NEGATIVE

## 2015-01-04 ENCOUNTER — Other Ambulatory Visit: Payer: Self-pay | Admitting: Internal Medicine

## 2015-07-07 ENCOUNTER — Other Ambulatory Visit: Payer: Self-pay | Admitting: Internal Medicine

## 2015-07-07 DIAGNOSIS — E119 Type 2 diabetes mellitus without complications: Secondary | ICD-10-CM

## 2015-07-07 DIAGNOSIS — L439 Lichen planus, unspecified: Secondary | ICD-10-CM

## 2015-07-07 NOTE — Telephone Encounter (Signed)
Please advise, last OV 07/03/14?

## 2015-07-09 NOTE — Telephone Encounter (Signed)
Refill denied.  She has not been seen since November 2015 and is 6  months overdue for diabetes follow up and labs.  Needs to have fasting labs done prior to visit

## 2015-07-13 ENCOUNTER — Telehealth: Payer: Self-pay | Admitting: *Deleted

## 2015-07-13 NOTE — Telephone Encounter (Signed)
Patient requested a medication refill for Lipitor.

## 2015-07-13 NOTE — Telephone Encounter (Signed)
I see that I ordered them in November,  But has she had  Them done elsewhere? Because they are not resulted

## 2015-07-13 NOTE — Telephone Encounter (Signed)
Last office visit was last year 2015, but had labs drawn on the 07/09/2015.  Please advise refill?

## 2015-07-20 NOTE — Telephone Encounter (Signed)
No refills until the labs are done .  Liver enzymes MUST be checked every 6 month son Lipitor.

## 2015-07-20 NOTE — Telephone Encounter (Signed)
Not drawn as of yet.

## 2015-09-02 ENCOUNTER — Encounter: Payer: Self-pay | Admitting: Internal Medicine

## 2015-09-02 ENCOUNTER — Other Ambulatory Visit (HOSPITAL_COMMUNITY)
Admission: RE | Admit: 2015-09-02 | Discharge: 2015-09-02 | Disposition: A | Discharge status: Home / Self Care | Payer: BLUE CROSS/BLUE SHIELD | Source: Ambulatory Visit | Attending: Internal Medicine | Admitting: Internal Medicine

## 2015-09-02 ENCOUNTER — Ambulatory Visit (INDEPENDENT_AMBULATORY_CARE_PROVIDER_SITE_OTHER): Payer: BLUE CROSS/BLUE SHIELD | Admitting: Internal Medicine

## 2015-09-02 VITALS — BP 160/90 | HR 75 | Temp 97.1°F | Resp 14 | Ht 65.0 in | Wt 171.0 lb

## 2015-09-02 DIAGNOSIS — I10 Essential (primary) hypertension: Secondary | ICD-10-CM

## 2015-09-02 DIAGNOSIS — Z0000 Encounter for general adult medical examination without abnormal findings: Secondary | ICD-10-CM | POA: Diagnosis not present

## 2015-09-02 DIAGNOSIS — Z720 Tobacco use: Secondary | ICD-10-CM | POA: Diagnosis not present

## 2015-09-02 DIAGNOSIS — Z1239 Encounter for other screening for malignant neoplasm of breast: Secondary | ICD-10-CM

## 2015-09-02 DIAGNOSIS — Z23 Encounter for immunization: Secondary | ICD-10-CM | POA: Diagnosis not present

## 2015-09-02 DIAGNOSIS — Z01419 Encounter for gynecological examination (general) (routine) without abnormal findings: Secondary | ICD-10-CM | POA: Diagnosis present

## 2015-09-02 DIAGNOSIS — E119 Type 2 diabetes mellitus without complications: Secondary | ICD-10-CM | POA: Diagnosis not present

## 2015-09-02 DIAGNOSIS — E785 Hyperlipidemia, unspecified: Secondary | ICD-10-CM

## 2015-09-02 DIAGNOSIS — Z124 Encounter for screening for malignant neoplasm of cervix: Secondary | ICD-10-CM

## 2015-09-02 DIAGNOSIS — Z1151 Encounter for screening for human papillomavirus (HPV): Secondary | ICD-10-CM | POA: Diagnosis not present

## 2015-09-02 DIAGNOSIS — Z716 Tobacco abuse counseling: Secondary | ICD-10-CM

## 2015-09-02 LAB — COMPREHENSIVE METABOLIC PANEL
ALBUMIN: 4.6 g/dL (ref 3.5–5.2)
ALT: 25 U/L (ref 0–35)
AST: 23 U/L (ref 0–37)
Alkaline Phosphatase: 36 U/L — ABNORMAL LOW (ref 39–117)
BILIRUBIN TOTAL: 0.7 mg/dL (ref 0.2–1.2)
BUN: 15 mg/dL (ref 6–23)
CALCIUM: 9.3 mg/dL (ref 8.4–10.5)
CHLORIDE: 103 meq/L (ref 96–112)
CO2: 30 mEq/L (ref 19–32)
CREATININE: 0.75 mg/dL (ref 0.40–1.20)
GFR: 82.63 mL/min (ref >60.00)
Glucose, Bld: 107 mg/dL — ABNORMAL HIGH (ref 70–99)
Potassium: 3.7 mEq/L (ref 3.5–5.1)
Sodium: 140 mEq/L (ref 135–145)
TOTAL PROTEIN: 7.2 g/dL (ref 6.0–8.3)

## 2015-09-02 LAB — LDL CHOLESTEROL, DIRECT: Direct LDL: 210 mg/dL

## 2015-09-02 LAB — LIPID PANEL
CHOLESTEROL: 323 mg/dL — AB (ref 0–200)
HDL: 79.1 mg/dL (ref >39.00)
LDL CALC: 214 mg/dL — AB (ref 0–99)
NonHDL: 243.65
TRIGLYCERIDES: 148 mg/dL (ref 0.0–149.0)
Total CHOL/HDL Ratio: 4
VLDL: 29.6 mg/dL (ref 0.0–40.0)

## 2015-09-02 LAB — MICROALBUMIN / CREATININE URINE RATIO
Creatinine,U: 56.1 mg/dL
Microalb Creat Ratio: 1.2 mg/g (ref 0.0–30.0)
Microalb, Ur: <0.7 mg/dL (ref 0.0–1.9)

## 2015-09-02 LAB — HEMOGLOBIN A1C: Hgb A1c MFr Bld: 5.9 % (ref 4.6–6.5)

## 2015-09-02 NOTE — Progress Notes (Addendum)
Patient ID: Amanda Everett, female    DOB: 1951-03-08  Age: 65 y.o. MRN: [id number]  The patient is here for annual preventive examination and management of other chronic and acute problems including diet controlled type 2 DM,  And hyperlipidemia.  Last seen .14 months ago (11/15) .  Lipitor was suspended due to being lost to follow up,  Last dose was in early December .  She is fasting today    The risk factors are reflected in the social history.  the patient was asked about her current tobacco use, counseled on the dangers of tobacco use, and was advised to quit.  Reviewed strategies to maximize success, including removing cigarettes and smoking materials from environment.  She is currently contemplative but not ready to quit.  Spent 3 minutes discussing risk of continued tobacco abuse, including but not limited to CAD, PAD, hypertension, and CA.  She is not interested in pharmacotherapy at this time.  The roster of all physicians providing medical care to patient - is listed in the Snapshot section of the chart.  Activities of daily living:  The patient is 100% independent in all ADLs: dressing, toileting, feeding as well as independent mobility  Home safety : The patient has smoke detectors in the home. They wear seatbelts.  There are no firearms at home. There is no violence in the home.   There is no risks for hepatitis, STDs or HIV. There is no   history of blood transfusion. They have no travel history to infectious disease endemic areas of the world.  The patient has seen their dentist in the last six month. They have seen their eye doctor in the last year. They admit to slight hearing difficulty with regard to whispered voices and some television programs.  They have deferred audiologic testing in the last year.  They do not  have excessive sun exposure. Discussed the need for sun protection: hats, long sleeves and use of sunscreen if there is significant sun exposure.   Diet: the importance  of a healthy diet is discussed. They do have a healthy diet.  The benefits of regular aerobic exercise were discussed. She does not exercise regularly.   Depression screen: there are no signs or vegative symptoms of depression- irritability, change in appetite, anhedonia, sadness/tearfullness.  Cognitive assessment: the patient manages all their financial and personal affairs and is actively engaged. They could relate day,date,year and events; recalled 2/3 objects at 3 minutes; performed clock-face test normally.  The following portions of the patient's history were reviewed and updated as appropriate: allergies, current medications, past family history, past medical history,  past surgical history, past social history  and problem list.  Visual acuity was not assessed per patient preference since she has regular follow up with her ophthalmologist. Hearing and body mass index were assessed and reviewed.   During the course of the visit the patient was educated and counseled about appropriate screening and preventive services including : fall prevention , diabetes screening, nutrition counseling, colorectal cancer screening, and recommended immunizations.    CC: The primary encounter diagnosis was Encounter for preventive health examination. Diagnoses of Breast cancer screening, Diabetes mellitus type 2, diet-controlled ([place]), Need for prophylactic vaccination against Streptococcus pneumoniae (pneumococcus), Need for prophylactic vaccination and inoculation against influenza, Cervical cancer screening, Tobacco abuse disorder, Hyperlipidemia LDL goal <100, Tobacco abuse counseling, and Essential hypertension were also pertinent to this visit.  History Amanda Everett has a past medical history of Chicken pox; Diabetes mellitus without complication ([place]);  Heart murmur; Colon polyps; Thyroid disease; and UTI (urinary tract infection).   She has past surgical history that includes Tubal ligation.   Her family  history includes Alcohol abuse in her father; Arthritis in her maternal grandmother; Cancer (age of onset: 19) in her father; Cancer (age of onset: 56) in her mother; Heart disease in her maternal grandmother; Stroke in her mother.She reports that she has been smoking Cigarettes.  She has been smoking about 0.50 packs per day. She has never used smokeless tobacco. She reports that she drinks about 0.6 oz of alcohol per week. She reports that she does not use illicit drugs.  Outpatient Prescriptions Prior to Visit  Medication Sig Dispense Refill  . diphenhydrAMINE (BENADRYL) 25 MG tablet Take 50 mg by mouth at bedtime as needed for itching.    . Probiotic Product (PROBIOTIC DAILY) CAPS Take 1 capsule by mouth daily.    . clotrimazole (LOTRIMIN) 1 % cream Apply 1 application topically 2 (two) times daily. 30 g 3  . ALPRAZolam (XANAX) 0.5 MG tablet Take 1 tablet (0.5 mg total) by mouth at bedtime as needed for sleep. 30 tablet 5  . atorvastatin (LIPITOR) 20 MG tablet TAKE 1 TABLET BY MOUTH EVERY DAY (Patient not taking: Reported on 09/02/2015) 30 tablet 5  . atorvastatin (LIPITOR) 20 MG tablet TAKE 1 TABLET BY MOUTH EVERY DAY 90 tablet 1  . diazepam (VALIUM) 10 MG tablet Take 1 tablet (10 mg total) by mouth every 12 (twelve) hours as needed for anxiety. 30 tablet 1  . fluocinonide cream (LIDEX) AB-123456789 % Apply 1 application topically 2 (two) times daily.    . fluocinonide gel (LIDEX) AB-123456789 % Apply 1 application topically 2 (two) times daily.    . traMADol (ULTRAM) 50 MG tablet Take 1 tablet (50 mg total) by mouth every 8 (eight) hours as needed. 60 tablet 0  . triamcinolone cream (KENALOG) 0.1 % Apply 1 application topically 2 (two) times daily.     No facility-administered medications prior to visit.    Review of Systems   Patient denies headache, fevers, malaise, unintentional weight loss, skin rash, eye pain, sinus congestion and sinus pain, sore throat, dysphagia,  hemoptysis , cough, dyspnea,  wheezing, chest pain, palpitations, orthopnea, edema, abdominal pain, nausea, melena, diarrhea, constipation, flank pain, dysuria, hematuria, urinary  Frequency, nocturia, numbness, tingling, seizures,  Focal weakness, Loss of consciousness,  Tremor, insomnia, depression, anxiety, and suicidal ideation.      Objective:  BP 160/90 mmHg  Pulse 75  Temp(Src) 97.1 F (36.2 C) (Oral)  Resp 14  Ht 5\' 5"  (1.651 m)  Wt 171 lb (77.565 kg)  BMI 28.46 kg/m2  SpO2 98%  Physical Exam    General Appearance:    Alert, cooperative, no distress, appears stated age  Head:    Normocephalic, without obvious abnormality, atraumatic  Eyes:    PERRL, conjunctiva/corneas clear, EOM's intact, fundi    benign, both eyes  Ears:    Normal TM's and external ear canals, both ears  Nose:   Nares normal, septum midline, mucosa normal, no drainage    or sinus tenderness  Throat:   Lips, mucosa, and tongue normal; teeth and gums normal  Neck:   Supple, symmetrical, trachea midline, no adenopathy;    thyroid:  no enlargement/tenderness/nodules; no carotid   bruit or JVD  Back:     Symmetric, no curvature, ROM normal, no CVA tenderness  Lungs:     Clear to auscultation bilaterally, respirations unlabored  Chest  Wall:    No tenderness or deformity   Heart:    Regular rate and rhythm, S1 and S2 normal, no murmur, rub   or gallop  Breast Exam:    No tenderness, masses, or nipple abnormality  Abdomen:     Soft, non-tender, bowel sounds active all four quadrants,    no masses, no organomegaly  Genitalia:    Pelvic: cervix normal in appearance, external genitalia normal, no adnexal masses or tenderness, no cervical motion tenderness, rectovaginal septum normal, uterus normal size, shape, and consistency and vagina normal without discharge  Extremities:   Extremities normal, atraumatic, no cyanosis or edema  Pulses:   2+ and symmetric all extremities  Skin:   Skin color, texture, turgor normal, no rashes or lesions   Lymph nodes:   Cervical, supraclavicular, and axillary nodes normal  Neurologic:   CNII-XII intact, normal strength, sensation and reflexes    throughout     Assessment & Plan:   Problem List Items Addressed This Visit    Essential hypertension    Starting losartan given concurrent Type 2 DM and tobacco abuse.       Relevant Medications   atorvastatin (LIPITOR) 20 MG tablet   losartan (COZAAR) 50 MG tablet   Diabetes mellitus type 2, diet-controlled ([place])    Well-controlled on diet alone by   .  She is behind on eye exams and her foot exam is normal.  She has no proteinuria. And will resume Lipitor for LDL > 200  Lab Results  Component Value Date   HGBA1C 5.9 09/02/2015   Lab Results  Component Value Date   MICROALBUR <0.7 09/02/2015   Lab Results  Component Value Date   CHOL 323* 09/02/2015   HDL 79.10 09/02/2015   LDLCALC 214* 09/02/2015   LDLDIRECT 210.0 09/02/2015   TRIG 148.0 09/02/2015   CHOLHDL 4 09/02/2015             Relevant Medications   atorvastatin (LIPITOR) 20 MG tablet   losartan (COZAAR) 50 MG tablet   Tobacco abuse disorder    Risks of continued tobacco use were discussed. She is not currently interested in tobacco cessation.       Hyperlipidemia LDL goal <100    Previously managed with atorvastatin, which was stopped due to lack of patient follow up,  LDL is > 200 without medications. She  has no side effects and liver enzymes are normal. Resume medication  And repeat lfts in 6 months  Lab Results  Component Value Date   CHOL 323* 09/02/2015   HDL 79.10 09/02/2015   LDLCALC 214* 09/02/2015   LDLDIRECT 210.0 09/02/2015   TRIG 148.0 09/02/2015   CHOLHDL 4 09/02/2015   Lab Results  Component Value Date   ALT 25 09/02/2015   AST 23 09/02/2015   ALKPHOS 36* 09/02/2015   BILITOT 0.7 09/02/2015            Relevant Medications   atorvastatin (LIPITOR) 20 MG tablet   losartan (COZAAR) 50 MG tablet   Encounter for preventive health  examination - Primary    Annual wellness  exam was done as well as a comprehensive physical exam  .  During the course of the visit the patient was educated and counseled about appropriate screening and preventive services and screenings were brought up to date for cervical and breast cancer .   Printed recommendations for health maintenance screenings was given.        Tobacco abuse counseling  The patient was counseled on the dangers of tobacco use, and was advised to quit.  Reviewed strategies to maximize success, including stress management.       Other Visit Diagnoses    Breast cancer screening        Relevant Orders    MM DIGITAL SCREENING BILATERAL    Need for prophylactic vaccination against Streptococcus pneumoniae (pneumococcus)        Relevant Orders    Pneumococcal polysaccharide vaccine 23-valent greater than or equal to 2yo subcutaneous/IM (Completed)    Need for prophylactic vaccination and inoculation against influenza        Relevant Orders    Flu Vaccine QUAD 36+ mos PF IM (Fluarix & Fluzone Quad PF) (Completed)    Cervical cancer screening        Relevant Orders    Cytology - PAP (Completed)       I have discontinued Ms. Kristiansen's fluocinonide cream, fluocinonide gel, triamcinolone cream, ALPRAZolam, traMADol, diazepam, clotrimazole, and atorvastatin. I have also changed her atorvastatin. Additionally, I am having her start on losartan. Lastly, I am having her maintain her diphenhydrAMINE and PROBIOTIC DAILY.  Meds ordered this encounter  Medications  . atorvastatin (LIPITOR) 20 MG tablet    Sig: Take 1 tablet (20 mg total) by mouth daily.    Dispense:  90 tablet    Refill:  1  . losartan (COZAAR) 50 MG tablet    Sig: Take 1 tablet (50 mg total) by mouth daily.    Dispense:  30 tablet    Refill:  5    Medications Discontinued During This Encounter  Medication Reason  . ALPRAZolam (XANAX) 0.5 MG tablet Completed Course  . atorvastatin (LIPITOR) 20 MG  tablet Completed Course  . triamcinolone cream (KENALOG) 0.1 % Completed Course  . traMADol (ULTRAM) 50 MG tablet Completed Course  . fluocinonide gel (LIDEX) 0.05 % Completed Course  . fluocinonide cream (LIDEX) 0.05 % Completed Course  . diazepam (VALIUM) 10 MG tablet Completed Course  . clotrimazole (LOTRIMIN) 1 % cream Completed Course  . atorvastatin (LIPITOR) 20 MG tablet Reorder    Follow-up: No Follow-up on file.   Crecencio Mc, MD

## 2015-09-02 NOTE — Progress Notes (Signed)
Pre visit review using our clinic review tool, if applicable. No additional management support is needed unless otherwise documented below in the visit note. 

## 2015-09-02 NOTE — Patient Instructions (Signed)
The  diet I discussed with you today is the 10 day Green Smoothie Cleansing /Detox Diet by Linden Dolin . available on [place] for around $10.  This is not a low carb or a weight loss diet,  It is fundamentally a "cleansing" low fat diet that eliminates sugar, gluten, caffeine, alcohol and dairy for 10 days .  What you add back after the initial ten days is entirely up to  you!  You can expect to lose 5 to 10 lbs depending on how strict you are.   I found that  drinking 2 smoothies or juices  daily and keeping one chewable meal (but keep it simple, like baked fish and  rice with a salad or green like sauteed [employer]) kept me satisfied and kept me from straying  .  You snack primarily on fresh  fruit, egg whites and judicious quantities of nuts. I  Recommend adding a  vegetable based protein powder  To any smoothie made with almond milk.  (nothing with whey , since whey is dairy) in it.  [employer] has a Research officer, political party .   It does require some form of a nutrient extractor (Vita Mix, a electric juicer,  Or a Nutribullet Rx).  i have found that using frozen fruits is much more convenient and cost effective. You can even find plenty of organic fruit in the frozen fruit section of [employer].  Just thaw what you need for the following day the night before in the refrigerator (to avoid jamming up your machine)   Once you finish the ten days,  You can add back dairy ,  Caffeine , etc,  But you will find yourself limiting them since you did so well without them.  This is a good thing!    Remember that [employer] and [employer] both have dessert flavored Greek yogurts that are low carb/low cal even when you top them with whipped cream!   Also, . [employer] now makes a frozen breakfast frittata that can be microwaved in 2 minutes and is very low carb.   Menopause is a normal process in which your reproductive ability comes to an end. This process happens gradually over a span of months to years, usually between the ages of 38  and 31. Menopause is complete when you have missed 12 consecutive menstrual periods. It is important to talk with your health care provider about some of the most common conditions that affect postmenopausal women, such as heart disease, cancer, and bone loss (osteoporosis). Adopting a healthy lifestyle and getting preventive care can help to promote your health and wellness. Those actions can also lower your chances of developing some of these common conditions. WHAT SHOULD I KNOW ABOUT MENOPAUSE? During menopause, you may experience a number of symptoms, such as:  Moderate-to-severe hot flashes.  Night sweats.  Decrease in sex drive.  Mood swings.  Headaches.  Tiredness.  Irritability.  Memory problems.  Insomnia. Choosing to treat or not to treat menopausal changes is an individual decision that you make with your health care provider. WHAT SHOULD I KNOW ABOUT HORMONE REPLACEMENT THERAPY AND SUPPLEMENTS? Hormone therapy products are effective for treating symptoms that are associated with menopause, such as hot flashes and night sweats. Hormone replacement carries certain risks, especially as you become older. If you are thinking about using estrogen or estrogen with progestin treatments, discuss the benefits and risks with your health care provider. WHAT SHOULD I KNOW ABOUT HEART DISEASE AND STROKE? Heart disease, heart attack,  and stroke become more likely as you age. This may be due, in part, to the hormonal changes that your body experiences during menopause. These can affect how your body processes dietary fats, triglycerides, and cholesterol. Heart attack and stroke are both medical emergencies. There are many things that you can do to help prevent heart disease and stroke:  Have your blood pressure checked at least every 1-2 years. High blood pressure causes heart disease and increases the risk of stroke.  If you are 28-60 years old, ask your health care provider if you  should take aspirin to prevent a heart attack or a stroke.  Do not use any tobacco products, including cigarettes, chewing tobacco, or electronic cigarettes. If you need help quitting, ask your health care provider.  It is important to eat a healthy diet and maintain a healthy weight.  Be sure to include plenty of vegetables, fruits, low-fat dairy products, and lean protein.  Avoid eating foods that are high in solid fats, added sugars, or salt (sodium).  Get regular exercise. This is one of the most important things that you can do for your health.  Try to exercise for at least 150 minutes each week. The type of exercise that you do should increase your heart rate and make you sweat. This is known as moderate-intensity exercise.  Try to do strengthening exercises at least twice each week. Do these in addition to the moderate-intensity exercise.  Know your numbers.Ask your health care provider to check your cholesterol and your blood glucose. Continue to have your blood tested as directed by your health care provider. WHAT SHOULD I KNOW ABOUT CANCER SCREENING? There are several types of cancer. Take the following steps to reduce your risk and to catch any cancer development as early as possible. Breast Cancer  Practice breast self-awareness.  This means understanding how your breasts normally appear and feel.  It also means doing regular breast self-exams. Let your health care provider know about any changes, no matter how small.  If you are 84 or older, have a clinician do a breast exam (clinical breast exam or CBE) every year. Depending on your age, family history, and medical history, it may be recommended that you also have a yearly breast X-ray (mammogram).  If you have a family history of breast cancer, talk with your health care provider about genetic screening.  If you are at high risk for breast cancer, talk with your health care provider about having an MRI and a mammogram  every year.  Breast cancer (BRCA) gene test is recommended for women who have family members with BRCA-related cancers. Results of the assessment will determine the need for genetic counseling and BRCA1 and for BRCA2 testing. BRCA-related cancers include these types:  Breast. This occurs in males or females.  Ovarian.  Tubal. This may also be called fallopian tube cancer.  Cancer of the abdominal or pelvic lining (peritoneal cancer).  Prostate.  Pancreatic. Cervical, Uterine, and Ovarian Cancer Your health care provider may recommend that you be screened regularly for cancer of the pelvic organs. These include your ovaries, uterus, and vagina. This screening involves a pelvic exam, which includes checking for microscopic changes to the surface of your cervix (Pap test).  For women ages 21-65, health care providers may recommend a pelvic exam and a Pap test every three years. For women ages 3-65, they may recommend the Pap test and pelvic exam, combined with testing for human papilloma virus (HPV), every five years. Some  types of HPV increase your risk of cervical cancer. Testing for HPV may also be done on women of any age who have unclear Pap test results.  Other health care providers may not recommend any screening for nonpregnant women who are considered low risk for pelvic cancer and have no symptoms. Ask your health care provider if a screening pelvic exam is right for you.  If you have had past treatment for cervical cancer or a condition that could lead to cancer, you need Pap tests and screening for cancer for at least 20 years after your treatment. If Pap tests have been discontinued for you, your risk factors (such as having a new sexual partner) need to be reassessed to determine if you should start having screenings again. Some women have medical problems that increase the chance of getting cervical cancer. In these cases, your health care provider may recommend that you have  screening and Pap tests more often.  If you have a family history of uterine cancer or ovarian cancer, talk with your health care provider about genetic screening.  If you have vaginal bleeding after reaching menopause, tell your health care provider.  There are currently no reliable tests available to screen for ovarian cancer. Lung Cancer Lung cancer screening is recommended for adults 1-29 years old who are at high risk for lung cancer because of a history of smoking. A yearly low-dose CT scan of the lungs is recommended if you:  Currently smoke.  Have a history of at least 30 pack-years of smoking and you currently smoke or have quit within the past 15 years. A pack-year is smoking an average of one pack of cigarettes per day for one year. Yearly screening should:  Continue until it has been 15 years since you quit.  Stop if you develop a health problem that would prevent you from having lung cancer treatment. Colorectal Cancer  This type of cancer can be detected and can often be prevented.  Routine colorectal cancer screening usually begins at age 31 and continues through age 73.  If you have risk factors for colon cancer, your health care provider may recommend that you be screened at an earlier age.  If you have a family history of colorectal cancer, talk with your health care provider about genetic screening.  Your health care provider may also recommend using home test kits to check for hidden blood in your stool.  A small camera at the end of a tube can be used to examine your colon directly (sigmoidoscopy or colonoscopy). This is done to check for the earliest forms of colorectal cancer.  Direct examination of the colon should be repeated every 5-10 years until age 1. However, if early forms of precancerous polyps or small growths are found or if you have a family history or genetic risk for colorectal cancer, you may need to be screened more often. Skin Cancer  Check  your skin from head to toe regularly.  Monitor any moles. Be sure to tell your health care provider:  About any new moles or changes in moles, especially if there is a change in a mole's shape or color.  If you have a mole that is larger than the size of a pencil eraser.  If any of your family members has a history of skin cancer, especially at a young age, talk with your health care provider about genetic screening.  Always use sunscreen. Apply sunscreen liberally and repeatedly throughout the day.  Whenever you are  outside, protect yourself by wearing long sleeves, pants, a wide-brimmed hat, and sunglasses. WHAT SHOULD I KNOW ABOUT OSTEOPOROSIS? Osteoporosis is a condition in which bone destruction happens more quickly than new bone creation. After menopause, you may be at an increased risk for osteoporosis. To help prevent osteoporosis or the bone fractures that can happen because of osteoporosis, the following is recommended:  If you are 46-77 years old, get at least 1,000 mg of calcium and at least 600 mg of vitamin D per day.  If you are older than age 53 but younger than age 32, get at least 1,200 mg of calcium and at least 600 mg of vitamin D per day.  If you are older than age 39, get at least 1,200 mg of calcium and at least 800 mg of vitamin D per day. Smoking and excessive alcohol intake increase the risk of osteoporosis. Eat foods that are rich in calcium and vitamin D, and do weight-bearing exercises several times each week as directed by your health care provider. WHAT SHOULD I KNOW ABOUT HOW MENOPAUSE AFFECTS [place]? Depression may occur at any age, but it is more common as you become older. Common symptoms of depression include:  Low or sad mood.  Changes in sleep patterns.  Changes in appetite or eating patterns.  Feeling an overall lack of motivation or enjoyment of activities that you previously enjoyed.  Frequent crying spells. Talk with your health  care provider if you think that you are experiencing depression. WHAT SHOULD I KNOW ABOUT IMMUNIZATIONS? It is important that you get and maintain your immunizations. These include:  Tetanus, diphtheria, and pertussis (Tdap) booster vaccine.  Influenza every year before the flu season begins.  Pneumonia vaccine.  Shingles vaccine. Your health care provider may also recommend other immunizations.   This information is not intended to replace advice given to you by your health care provider. Make sure you discuss any questions you have with your health care provider.   Document Released: 09/30/2005 Document Revised: 08/29/2014 Document Reviewed: 04/10/2014 Elsevier Interactive Patient Education [employer].   The organic vegan protein powder I tried  is called Advertising copywriter" and I found it at [employer] .  It is sugar free

## 2015-09-03 LAB — CYTOLOGY - PAP

## 2015-09-05 DIAGNOSIS — Z716 Tobacco abuse counseling: Secondary | ICD-10-CM | POA: Insufficient documentation

## 2015-09-05 MED ORDER — ATORVASTATIN CALCIUM 20 MG PO TABS: 20.0000 mg | ORAL_TABLET | Freq: Every day | ORAL | Status: DC

## 2015-09-05 NOTE — Assessment & Plan Note (Signed)
The patient was counseled on the dangers of tobacco use, and was advised to quit.  Reviewed strategies to maximize success, including stress management.

## 2015-09-05 NOTE — Assessment & Plan Note (Signed)
Annual wellness  exam was done as well as a comprehensive physical exam  .  During the course of the visit the patient was educated and counseled about appropriate screening and preventive services and screenings were brought up to date for cervical and breast cancer .   Printed recommendations for health maintenance screenings was given.

## 2015-09-05 NOTE — Assessment & Plan Note (Signed)
Previously managed with atorvastatin, which was stopped due to lack of patient follow up,  LDL is > 200 without medications. She  has no side effects and liver enzymes are normal. Resume medication  And repeat lfts in 6 months  Lab Results  Component Value Date   CHOL 323* 09/02/2015   HDL 79.10 09/02/2015   LDLCALC 214* 09/02/2015   LDLDIRECT 210.0 09/02/2015   TRIG 148.0 09/02/2015   CHOLHDL 4 09/02/2015   Lab Results  Component Value Date   ALT 25 09/02/2015   AST 23 09/02/2015   ALKPHOS 36* 09/02/2015   BILITOT 0.7 09/02/2015

## 2015-09-05 NOTE — Assessment & Plan Note (Signed)
Well-controlled on diet alone by   .  She is behind on eye exams and her foot exam is normal.  She has no proteinuria. And will resume Lipitor for LDL > 200  Lab Results  Component Value Date   HGBA1C 5.9 09/02/2015   Lab Results  Component Value Date   MICROALBUR <0.7 09/02/2015   Lab Results  Component Value Date   CHOL 323* 09/02/2015   HDL 79.10 09/02/2015   LDLCALC 214* 09/02/2015   LDLDIRECT 210.0 09/02/2015   TRIG 148.0 09/02/2015   CHOLHDL 4 09/02/2015

## 2015-09-05 NOTE — Addendum Note (Signed)
Addended by: Crecencio Mc on: 09/05/2015 08:46 PM   Modules accepted: Orders, SmartSet

## 2015-09-05 NOTE — Assessment & Plan Note (Signed)
Risks of continued tobacco use were discussed. She is not currently interested in tobacco cessation.    

## 2015-09-06 ENCOUNTER — Other Ambulatory Visit: Payer: Self-pay | Admitting: Internal Medicine

## 2015-09-06 DIAGNOSIS — I10 Essential (primary) hypertension: Secondary | ICD-10-CM | POA: Insufficient documentation

## 2015-09-06 MED ORDER — LOSARTAN POTASSIUM 50 MG PO TABS: 50.0000 mg | ORAL_TABLET | Freq: Every day | ORAL | Status: DC

## 2015-09-06 NOTE — Assessment & Plan Note (Signed)
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

## 2015-09-06 NOTE — Addendum Note (Signed)
Addended by: Crecencio Mc on: 09/06/2015 04:26 PM   Modules accepted: Orders, SmartSet

## 2015-09-22 ENCOUNTER — Other Ambulatory Visit (INDEPENDENT_AMBULATORY_CARE_PROVIDER_SITE_OTHER): Payer: BLUE CROSS/BLUE SHIELD

## 2015-09-22 ENCOUNTER — Ambulatory Visit (INDEPENDENT_AMBULATORY_CARE_PROVIDER_SITE_OTHER): Payer: BLUE CROSS/BLUE SHIELD

## 2015-09-22 VITALS — BP 138/78 | HR 70 | Resp 18

## 2015-09-22 DIAGNOSIS — I10 Essential (primary) hypertension: Secondary | ICD-10-CM

## 2015-09-22 LAB — COMPREHENSIVE METABOLIC PANEL
ALBUMIN: 4.4 g/dL (ref 3.5–5.2)
ALK PHOS: 39 U/L (ref 39–117)
ALT: 34 U/L (ref 0–35)
AST: 27 U/L (ref 0–37)
BILIRUBIN TOTAL: 0.7 mg/dL (ref 0.2–1.2)
BUN: 14 mg/dL (ref 6–23)
CALCIUM: 9.8 mg/dL (ref 8.4–10.5)
CO2: 30 meq/L (ref 19–32)
CREATININE: 0.76 mg/dL (ref 0.40–1.20)
Chloride: 103 mEq/L (ref 96–112)
GFR: 81.36 mL/min (ref >60.00)
Glucose, Bld: 108 mg/dL — ABNORMAL HIGH (ref 70–99)
Potassium: 4.2 mEq/L (ref 3.5–5.1)
Sodium: 140 mEq/L (ref 135–145)
TOTAL PROTEIN: 7.8 g/dL (ref 6.0–8.3)

## 2015-09-22 NOTE — Progress Notes (Signed)
Patient was started on Losartan.  Has not checked her BP at home attempted to teach her husband to check and it was not successful.  See Vital section for details on BP reading today.  Please advise for changes. Thanks

## 2015-09-25 NOTE — Progress Notes (Signed)
BP is at goal,  No changes .  Continue losartan .   I have reviewed the above information and agree with above.   Deborra Medina, MD

## 2015-09-28 ENCOUNTER — Encounter: Payer: Self-pay | Admitting: *Deleted

## 2015-09-28 LAB — HM MAMMOGRAPHY: HM Mammogram: NEGATIVE

## 2015-09-29 ENCOUNTER — Encounter: Payer: Self-pay | Admitting: Internal Medicine

## 2015-11-18 ENCOUNTER — Other Ambulatory Visit: Payer: Self-pay

## 2015-11-18 MED ORDER — ATORVASTATIN CALCIUM 20 MG PO TABS: 20.0000 mg | ORAL_TABLET | Freq: Every day | ORAL | Status: DC

## 2015-11-18 MED ORDER — LOSARTAN POTASSIUM 50 MG PO TABS: 50.0000 mg | ORAL_TABLET | Freq: Every day | ORAL | Status: DC

## 2016-07-26 DIAGNOSIS — Z23 Encounter for immunization: Secondary | ICD-10-CM | POA: Diagnosis not present

## 2016-08-08 ENCOUNTER — Other Ambulatory Visit: Payer: Self-pay | Admitting: Internal Medicine

## 2016-08-10 ENCOUNTER — Telehealth: Payer: Self-pay | Admitting: Internal Medicine

## 2016-08-10 NOTE — Telephone Encounter (Signed)
Pt called and is requesting a refill on atorvastatin (LIPITOR) 20 MG tablet and losartan (COZAAR) 50 MG tablet. Pt is scheduled for 09/16/16. Please advise, thank you!  Pharmacy - [facility], [place]  Call pt @ [phone]

## 2016-08-11 MED ORDER — ATORVASTATIN CALCIUM 20 MG PO TABS: 20.0000 mg | ORAL_TABLET | Freq: Every day | ORAL | 0 refills | Status: DC

## 2016-08-11 NOTE — Telephone Encounter (Signed)
Patient has scheduled an appointment so I filled atorvastatin til appointment. FYI

## 2016-09-16 ENCOUNTER — Encounter: Payer: Self-pay | Admitting: Internal Medicine

## 2016-09-16 ENCOUNTER — Ambulatory Visit (INDEPENDENT_AMBULATORY_CARE_PROVIDER_SITE_OTHER): Payer: Medicare Other | Admitting: Internal Medicine

## 2016-09-16 VITALS — BP 150/98 | HR 69 | Temp 98.0°F | Resp 16 | Ht 65.0 in | Wt 182.2 lb

## 2016-09-16 DIAGNOSIS — Z780 Asymptomatic menopausal state: Secondary | ICD-10-CM

## 2016-09-16 DIAGNOSIS — Z1239 Encounter for other screening for malignant neoplasm of breast: Secondary | ICD-10-CM

## 2016-09-16 DIAGNOSIS — E7800 Pure hypercholesterolemia, unspecified: Secondary | ICD-10-CM

## 2016-09-16 DIAGNOSIS — I10 Essential (primary) hypertension: Secondary | ICD-10-CM

## 2016-09-16 DIAGNOSIS — R7989 Other specified abnormal findings of blood chemistry: Secondary | ICD-10-CM

## 2016-09-16 DIAGNOSIS — R946 Abnormal results of thyroid function studies: Secondary | ICD-10-CM

## 2016-09-16 DIAGNOSIS — R5383 Other fatigue: Secondary | ICD-10-CM

## 2016-09-16 DIAGNOSIS — R7303 Prediabetes: Secondary | ICD-10-CM | POA: Diagnosis not present

## 2016-09-16 DIAGNOSIS — E6609 Other obesity due to excess calories: Secondary | ICD-10-CM

## 2016-09-16 DIAGNOSIS — Z23 Encounter for immunization: Secondary | ICD-10-CM | POA: Diagnosis not present

## 2016-09-16 DIAGNOSIS — Z87891 Personal history of nicotine dependence: Secondary | ICD-10-CM

## 2016-09-16 DIAGNOSIS — E559 Vitamin D deficiency, unspecified: Secondary | ICD-10-CM

## 2016-09-16 DIAGNOSIS — Z800 Family history of malignant neoplasm of digestive organs: Secondary | ICD-10-CM

## 2016-09-16 DIAGNOSIS — Z1231 Encounter for screening mammogram for malignant neoplasm of breast: Secondary | ICD-10-CM

## 2016-09-16 DIAGNOSIS — R635 Abnormal weight gain: Secondary | ICD-10-CM | POA: Diagnosis not present

## 2016-09-16 DIAGNOSIS — R739 Hyperglycemia, unspecified: Secondary | ICD-10-CM

## 2016-09-16 DIAGNOSIS — Z6830 Body mass index (BMI) 30.0-30.9, adult: Secondary | ICD-10-CM

## 2016-09-16 DIAGNOSIS — E119 Type 2 diabetes mellitus without complications: Secondary | ICD-10-CM

## 2016-09-16 LAB — COMPREHENSIVE METABOLIC PANEL
ALBUMIN: 4.7 g/dL (ref 3.5–5.2)
ALK PHOS: 41 U/L (ref 39–117)
ALT: 25 U/L (ref 0–35)
AST: 24 U/L (ref 0–37)
BUN: 13 mg/dL (ref 6–23)
CO2: 31 mEq/L (ref 19–32)
Calcium: 9.7 mg/dL (ref 8.4–10.5)
Chloride: 103 mEq/L (ref 96–112)
Creatinine, Ser: 0.79 mg/dL (ref 0.40–1.20)
GFR: 77.56 mL/min (ref >60.00)
Glucose, Bld: 119 mg/dL — ABNORMAL HIGH (ref 70–99)
POTASSIUM: 4.2 meq/L (ref 3.5–5.1)
Sodium: 142 mEq/L (ref 135–145)
TOTAL PROTEIN: 7.7 g/dL (ref 6.0–8.3)
Total Bilirubin: 0.7 mg/dL (ref 0.2–1.2)

## 2016-09-16 LAB — LIPID PANEL
Cholesterol: 205 mg/dL — ABNORMAL HIGH (ref 0–200)
HDL: 75.3 mg/dL (ref >39.00)
LDL Cholesterol: 101 mg/dL — ABNORMAL HIGH (ref 0–99)
NONHDL: 129.51
TRIGLYCERIDES: 141 mg/dL (ref 0.0–149.0)
Total CHOL/HDL Ratio: 3
VLDL: 28.2 mg/dL (ref 0.0–40.0)

## 2016-09-16 LAB — VITAMIN D 25 HYDROXY (VIT D DEFICIENCY, FRACTURES): VITD: 29.49 ng/mL — AB (ref 30.00–100.00)

## 2016-09-16 LAB — TSH: TSH: 4.88 u[IU]/mL — ABNORMAL HIGH (ref 0.35–4.50)

## 2016-09-16 LAB — POCT GLYCOSYLATED HEMOGLOBIN (HGB A1C): Hemoglobin A1C: 5.9

## 2016-09-16 MED ORDER — PROMETHAZINE HCL 12.5 MG PO TABS: 12.5000 mg | ORAL_TABLET | Freq: Three times a day (TID) | ORAL | 0 refills | Status: DC | PRN

## 2016-09-16 MED ORDER — CIPROFLOXACIN HCL 500 MG PO TABS: 500.0000 mg | ORAL_TABLET | Freq: Two times a day (BID) | ORAL | 0 refills | Status: DC

## 2016-09-16 NOTE — Patient Instructions (Addendum)
Try [employer] for your spaghetti,  [facility]ated  veggies,  Near the  [employer]    The ShingRx vaccine will be available in about 6 months and IS ADVISED for all interested adults over 50 to prevent shingles   You received  the prevnar vaccine today for protection againts strep pneumoniae   TAKE THE CIPR0 AND PHENERGAN  WITH YOU ON THE CRUISE IN CASE YOU GET A GI ILLNESS (DIARRHEA, VOMITING0  MAMMOGRAM, DEXA SCAN AND COLONOSCOPY REFERRAL ARE ALL IN PROCESS  DR JEFF BYRNETT IS THE BEST GENERAL SURGEON IN [place],  HE IS GOING TO DO YOUR COLONOSCOPY

## 2016-09-16 NOTE — Progress Notes (Signed)
Pre-visit discussion using our clinic review tool. No additional management support is needed unless otherwise documented below in the visit note.  

## 2016-09-16 NOTE — Progress Notes (Signed)
Dictation #1 [id number]  [id number] Patient ID: Amanda Everett, female    DOB: 12-24-1950  Age: 66 y.o. MRN: [id number]  The patient is here for  management of other chronic and acute problems.  LAST SEEN A YEAR AGO   PAP due 2020 Needs mammogram, DEXA and referral for 5 yr follow up  COLONOSCOPY  With [facility]  Due In July  NEEDS A1C FOR prediabetes  FOLLOW UP  hiv screen remotely negative  HAD FLU vaccine    The risk factors are reflected in the social history.  The roster of all physicians providing medical care to patient - is listed in the Snapshot section of the chart.  Activities of daily living:  The patient is 100% independent in all ADLs: dressing, toileting, feeding as well as independent mobility  Home safety : The patient has smoke detectors in the home. They wear seatbelts.  There are no firearms at home. There is no violence in the home.   There is no risks for hepatitis, STDs or HIV. There is no   history of blood transfusion. They have no travel history to infectious disease endemic areas of the world.  The patient has seen their dentist in the last six month. They have seen their eye doctor in the last year. They admit to slight hearing difficulty with regard to whispered voices and some television programs.  They have deferred audiologic testing in the last year.  They do not  have excessive sun exposure. Discussed the need for sun protection: hats, long sleeves and use of sunscreen if there is significant sun exposure.   Diet: the importance of a healthy diet is discussed. They do have a healthy diet.  The benefits of regular aerobic exercise were discussed. She walks 4 times per week ,  20 minutes.   Depression screen: there are no signs or vegative symptoms of depression- irritability, change in appetite, anhedonia, sadness/tearfullness.  Cognitive assessment: the patient manages all their financial and personal affairs and is actively engaged. They could  relate day,date,year and events; recalled 2/3 objects at 3 minutes; performed clock-face test normally.  The following portions of the patient's history were reviewed and updated as appropriate: allergies, current medications, past family history, past medical history,  past surgical history, past social history  and problem list.  Visual acuity was not assessed per patient preference since she has regular follow up with her ophthalmologist. Hearing and body mass index were assessed and reviewed.   During the course of the visit the patient was educated and counseled about appropriate screening and preventive services including : fall prevention , diabetes screening, nutrition counseling, colorectal cancer screening, and recommended immunizations.    CC: The primary encounter diagnosis was Vitamin D deficiency. Diagnoses of Hyperglycemia, Prediabetes, Fatigue, unspecified type, Postmenopausal estrogen deficiency, Breast cancer screening, Family history of colon cancer requiring screening colonoscopy, Pure hypercholesterolemia, Weight gain, Need for prophylactic vaccination against Streptococcus pneumoniae (pneumococcus), History of tobacco abuse, Diabetes mellitus type 2, diet-controlled ([place]), Essential hypertension, Class 1 obesity due to excess calories without serious comorbidity with body mass index (BMI) of 30.0 to 30.9 in adult, and Elevated TSH were also pertinent to this visit.  Wt gain.  Shocked at the gain. Quit smoking in September , stopped swimming,  Hurt right knee tripping on a kitten. So not using treadmill, then had a 4 week respiratory illness   Knee feels better except when kneeling  Has changed diet.eliminated bread,  Sweets  .  Has resumed walking  History Keonna has a past medical history of Chicken pox; Colon polyps; Diabetes mellitus without complication ([place]); Heart murmur; Shingles; Thyroid disease; and UTI (urinary tract infection).   She has a past surgical history  that includes Tubal ligation.   Her family history includes Alcohol abuse in her father; Arthritis in her maternal grandmother; Cancer (age of onset: 8) in her father; Cancer (age of onset: 35) in her mother; Heart disease in her maternal grandmother; Stroke in her mother.She reports that she quit smoking about 4 months ago. Her smoking use included Cigarettes. She has a 17.50 pack-year smoking history. She has never used smokeless tobacco. She reports that she drinks about 0.6 oz of alcohol per week . She reports that she does not use drugs.  Outpatient Medications Prior to Visit  Medication Sig Dispense Refill  . atorvastatin (LIPITOR) 20 MG tablet Take 1 tablet (20 mg total) by mouth daily. MUST SCHEDULE OV FOR FURTHER REFLLS 90 tablet 0  . diphenhydrAMINE (BENADRYL) 25 MG tablet Take 50 mg by mouth as needed for itching.     . Probiotic Product (PROBIOTIC DAILY) CAPS Take 1 capsule by mouth daily.    Marland Kitchen losartan (COZAAR) 50 MG tablet Take 1 tablet (50 mg total) by mouth daily. MUST SCHEDULE OFFICE VISIT FOR FURTHER REFILLS 90 tablet 0   No facility-administered medications prior to visit.     Review of Systems   Patient denies headache, fevers, malaise, unintentional weight loss, skin rash, eye pain, sinus congestion and sinus pain, sore throat, dysphagia,  hemoptysis , cough, dyspnea, wheezing, chest pain, palpitations, orthopnea, edema, abdominal pain, nausea, melena, diarrhea, constipation, flank pain, dysuria, hematuria, urinary  Frequency, nocturia, numbness, tingling, seizures,  Focal weakness, Loss of consciousness,  Tremor, insomnia, depression, anxiety, and suicidal ideation.      Objective:  BP (!) 150/98   Pulse 69   Temp 98 F (36.7 C) (Oral)   Resp 16   Ht 5\' 5"  (1.651 m)   Wt 182 lb 4 oz (82.7 kg)   SpO2 92%   BMI 30.33 kg/m   Physical Exam   General appearance: alert, cooperative and appears stated age Head: Normocephalic, without obvious abnormality,  atraumatic Eyes: conjunctivae/corneas clear. PERRL, EOM's intact. Fundi benign. Ears: normal TM's and external ear canals both ears Nose: Nares normal. Septum midline. Mucosa normal. No drainage or sinus tenderness. Throat: lips, mucosa, and tongue normal; teeth and gums normal Neck: no adenopathy, no carotid bruit, no JVD, supple, symmetrical, trachea midline and thyroid not enlarged, symmetric, no tenderness/mass/nodules Lungs: clear to auscultation bilaterally Breasts: normal appearance, no masses or tenderness Heart: regular rate and rhythm, S1, S2 normal, no murmur, click, rub or gallop Abdomen: soft, non-tender; bowel sounds normal; no masses,  no organomegaly Extremities: extremities normal, atraumatic, no cyanosis or edema Pulses: 2+ and symmetric Skin: Skin color, texture, turgor normal. No rashes or lesions Neurologic: Alert and oriented X 3, normal strength and tone. Normal symmetric reflexes. Normal coordination and gait.     Assessment & Plan:   Problem List Items Addressed This Visit    Diabetes mellitus type 2, diet-controlled ([place])    Remains Well-controlled on diet alone.  She is behind on eye exams and her foot exam is normal.  She has no proteinuria. And has resumed Lipitor for LDL > 200  Lab Results  Component Value Date   HGBA1C 5.9 09/16/2016   Lab Results  Component Value Date   MICROALBUR <0.7 09/02/2015   Lab Results  Component  Value Date   CHOL 205 (H) 09/16/2016   HDL 75.30 09/16/2016   LDLCALC 101 (H) 09/16/2016   LDLDIRECT 210.0 09/02/2015   TRIG 141.0 09/16/2016   CHOLHDL 3 09/16/2016             Relevant Medications   losartan (COZAAR) 100 MG tablet   Elevated TSH    Minimal, with only weight gain as the attributable symptoms.  Recommend repeating it in 6 weeks      Essential hypertension    NOT at goal on 50 mg  Losartan.  increasing dose to 100 mg daily       Relevant Medications   losartan (COZAAR) 100 MG tablet   History of  tobacco abuse    Patient quit sept 2017 .        Obesity    Aggravated by quitting smoking. I spent 15 minutes addressing  BMI and recommended wt loss of 10% of body weight over the next 6 months using a low glycemic index diet and regular exercise a minimum of 5 days per week.        Other Visit Diagnoses    Vitamin D deficiency    -  Primary   Relevant Orders   VITAMIN D 25 Hydroxy (Vit-D Deficiency, Fractures) (Completed)   Hyperglycemia       Relevant Orders   POCT glycosylated hemoglobin (Hb A1C) (Completed)   Prediabetes       Relevant Orders   Comprehensive metabolic panel (Completed)   Fatigue, unspecified type       Postmenopausal estrogen deficiency       Relevant Orders   DG Bone Density   Breast cancer screening       Relevant Orders   MM DIGITAL SCREENING BILATERAL   Family history of colon cancer requiring screening colonoscopy       Relevant Orders   Ambulatory referral to General Surgery   Pure hypercholesterolemia       Relevant Medications   losartan (COZAAR) 100 MG tablet   Other Relevant Orders   Lipid panel (Completed)   Weight gain       Relevant Orders   TSH (Completed)   Need for prophylactic vaccination against Streptococcus pneumoniae (pneumococcus)       Relevant Orders   Pneumococcal polysaccharide vaccine 23-valent greater than or equal to 2yo subcutaneous/IM (Completed)     A total of 40 minutes was spent with patient more than half of which was spent in counseling patient on the above mentioned issues , reviewing and explaining recent labs and imaging studies done, and coordination of care. I have changed Ms. Ackerman's losartan. I am also having her start on ciprofloxacin and promethazine. Additionally, I am having her maintain her diphenhydrAMINE, PROBIOTIC DAILY, and atorvastatin.  Meds ordered this encounter  Medications  . ciprofloxacin (CIPRO) 500 MG tablet    Sig: Take 1 tablet (500 mg total) by mouth 2 (two) times daily.     Dispense:  20 tablet    Refill:  0  . promethazine (PHENERGAN) 12.5 MG tablet    Sig: Take 1 tablet (12.5 mg total) by mouth every 8 (eight) hours as needed for nausea or vomiting.    Dispense:  20 tablet    Refill:  0  . losartan (COZAAR) 100 MG tablet    Sig: Take 1 tablet (100 mg total) by mouth daily.    Dispense:  90 tablet    Refill:  0    NOTE DOSE INCREASE.  KEEP ON FILE FOR NEXT REFILL    Medications Discontinued During This Encounter  Medication Reason  . losartan (COZAAR) 50 MG tablet Reorder    Follow-up: No Follow-up on file.   Crecencio Mc, MD

## 2016-09-18 ENCOUNTER — Encounter: Payer: Self-pay | Admitting: Internal Medicine

## 2016-09-18 DIAGNOSIS — E669 Obesity, unspecified: Secondary | ICD-10-CM | POA: Insufficient documentation

## 2016-09-18 DIAGNOSIS — R7989 Other specified abnormal findings of blood chemistry: Secondary | ICD-10-CM | POA: Insufficient documentation

## 2016-09-18 DIAGNOSIS — E663 Overweight: Secondary | ICD-10-CM | POA: Insufficient documentation

## 2016-09-18 MED ORDER — LOSARTAN POTASSIUM 100 MG PO TABS: 100.0000 mg | ORAL_TABLET | Freq: Every day | ORAL | 0 refills | Status: DC

## 2016-09-18 NOTE — Assessment & Plan Note (Signed)
Remains Well-controlled on diet alone.  She is behind on eye exams and her foot exam is normal.  She has no proteinuria. And has resumed Lipitor for LDL > 200  Lab Results  Component Value Date   HGBA1C 5.9 09/16/2016   Lab Results  Component Value Date   MICROALBUR <0.7 09/02/2015   Lab Results  Component Value Date   CHOL 205 (H) 09/16/2016   HDL 75.30 09/16/2016   LDLCALC 101 (H) 09/16/2016   LDLDIRECT 210.0 09/02/2015   TRIG 141.0 09/16/2016   CHOLHDL 3 09/16/2016

## 2016-09-18 NOTE — Assessment & Plan Note (Signed)
Patient quit sept 2017 .

## 2016-09-18 NOTE — Assessment & Plan Note (Addendum)
NOT at goal on 50 mg  Losartan.  increasing dose to 100 mg daily

## 2016-09-18 NOTE — Assessment & Plan Note (Signed)
Minimal, with only weight gain as the attributable symptoms.  Recommend repeating it in 6 weeks

## 2016-09-18 NOTE — Assessment & Plan Note (Signed)
Aggravated by quitting smoking. I spent 15 minutes addressing  BMI and recommended wt loss of 10% of body weight over the next 6 months using a low glycemic index diet and regular exercise a minimum of 5 days per week.

## 2016-09-20 ENCOUNTER — Telehealth: Payer: Self-pay | Admitting: *Deleted

## 2016-09-20 ENCOUNTER — Other Ambulatory Visit: Payer: Self-pay

## 2016-09-20 MED ORDER — CIPROFLOXACIN HCL 500 MG PO TABS: 500.0000 mg | ORAL_TABLET | Freq: Two times a day (BID) | ORAL | 0 refills | Status: DC

## 2016-09-20 MED ORDER — PROMETHAZINE HCL 12.5 MG PO TABS: 12.5000 mg | ORAL_TABLET | Freq: Three times a day (TID) | ORAL | 0 refills | Status: DC | PRN

## 2016-09-20 NOTE — Telephone Encounter (Signed)
Pt has requested lab results Pt contact [phone]

## 2016-09-21 ENCOUNTER — Other Ambulatory Visit: Payer: Self-pay

## 2016-09-21 DIAGNOSIS — E2839 Other primary ovarian failure: Secondary | ICD-10-CM

## 2016-09-21 MED ORDER — LOSARTAN POTASSIUM 100 MG PO TABS: 100.0000 mg | ORAL_TABLET | Freq: Every day | ORAL | 0 refills | Status: DC

## 2016-09-21 MED ORDER — ATORVASTATIN CALCIUM 20 MG PO TABS: 20.0000 mg | ORAL_TABLET | Freq: Every day | ORAL | 1 refills | Status: DC

## 2016-10-04 DIAGNOSIS — Z1231 Encounter for screening mammogram for malignant neoplasm of breast: Secondary | ICD-10-CM | POA: Diagnosis not present

## 2016-10-04 LAB — HM MAMMOGRAPHY

## 2016-10-05 ENCOUNTER — Ambulatory Visit (INDEPENDENT_AMBULATORY_CARE_PROVIDER_SITE_OTHER): Payer: Medicare Other

## 2016-10-05 VITALS — BP 116/74 | HR 72 | Resp 16

## 2016-10-05 DIAGNOSIS — I10 Essential (primary) hypertension: Secondary | ICD-10-CM | POA: Diagnosis not present

## 2016-10-05 NOTE — Progress Notes (Addendum)
Patient comes in for blood pressure check.   She did not increase Losartan 100 mg as directed from office visit on 1/26/ 2018.   Patient states she was afraid to increase due to she was scared blood pressure would bottom out she has been monitoring at home she has remained on Losartan 50 mg 1 daily. See blood pressure readings  as follows  2/6/ 1:30pm 120/66 2/7 8:30pm 120/70 2/8 1200pm 118/68 2/9 11:00am 120/70 2/10 1:00 pm 110/66          5:45pm 124/68 2/11 10:00pm 130/66 10/03/16 1:00pm 122/64               7:00pm 118/64 2/13 10:30am  122/70          10:00pm 120/70 2/14  10:00am 118/78  Please advise.

## 2016-10-06 NOTE — Progress Notes (Signed)
Agree. She does not need to increase her losartan from 50 mg to 100  Continue 50 mg daily  Regards,   Deborra Medina, MD

## 2016-10-07 NOTE — Progress Notes (Signed)
Patient advised of below and verbalized understanding.  

## 2016-10-10 ENCOUNTER — Encounter: Payer: Self-pay | Admitting: Internal Medicine

## 2016-10-14 DIAGNOSIS — M85852 Other specified disorders of bone density and structure, left thigh: Secondary | ICD-10-CM | POA: Diagnosis not present

## 2016-10-14 DIAGNOSIS — M8588 Other specified disorders of bone density and structure, other site: Secondary | ICD-10-CM | POA: Diagnosis not present

## 2016-10-14 DIAGNOSIS — E2839 Other primary ovarian failure: Secondary | ICD-10-CM | POA: Diagnosis not present

## 2016-10-14 DIAGNOSIS — Z780 Asymptomatic menopausal state: Secondary | ICD-10-CM | POA: Diagnosis not present

## 2016-10-14 LAB — HM DEXA SCAN

## 2016-11-15 ENCOUNTER — Telehealth: Payer: Self-pay | Admitting: Internal Medicine

## 2016-11-15 NOTE — Telephone Encounter (Signed)
Pt called stating that the medication of losartan (COZAAR) 100 MG tablet should be 50mg . Pt states Dr Derrel Nip had decreased the mg. A [facility]. Please and thank you!  Call pt @ [phone].

## 2016-11-17 MED ORDER — LOSARTAN POTASSIUM 50 MG PO TABS: 50.0000 mg | ORAL_TABLET | Freq: Every day | ORAL | 1 refills | Status: DC

## 2016-11-17 NOTE — Telephone Encounter (Signed)
New rx sent, notified patient.

## 2016-11-21 ENCOUNTER — Telehealth: Payer: Self-pay | Admitting: *Deleted

## 2016-11-21 NOTE — Telephone Encounter (Signed)
[facility] requested clarity on on losartan Rx  Contact [phone] Ref# [id number]

## 2016-11-21 NOTE — Telephone Encounter (Signed)
Called [facility] and clarified, thanks

## 2017-03-09 ENCOUNTER — Encounter: Payer: Self-pay | Admitting: *Deleted

## 2017-03-14 ENCOUNTER — Ambulatory Visit (INDEPENDENT_AMBULATORY_CARE_PROVIDER_SITE_OTHER): Payer: Medicare Other | Admitting: General Surgery

## 2017-03-14 ENCOUNTER — Encounter: Payer: Self-pay | Admitting: General Surgery

## 2017-03-14 VITALS — BP 122/70 | HR 91 | Resp 12 | Ht 67.0 in | Wt 177.0 lb

## 2017-03-14 DIAGNOSIS — Z800 Family history of malignant neoplasm of digestive organs: Secondary | ICD-10-CM | POA: Diagnosis not present

## 2017-03-14 DIAGNOSIS — Z860100 Personal history of colon polyps, unspecified: Secondary | ICD-10-CM | POA: Insufficient documentation

## 2017-03-14 MED ORDER — POLYETHYLENE GLYCOL 3350 17 GM/SCOOP PO POWD: 1.0000 | Freq: Once | ORAL | 0 refills | Status: AC

## 2017-03-14 NOTE — Progress Notes (Signed)
Patient ID: Amanda Everett, female   DOB: 12-Aug-1951, 66 y.o.   MRN: [id number]  Chief Complaint  Patient presents with  . Colonoscopy    HPI Alfreda Hammad is a 66 y.o. female here today to discuss a colonoscopy. Last colonoscopy was done in 2013. Patient states she has no GI problems at this time. Moves her bowels daily. Family history of colon cancer, Her father reported diagnosed at age 65, died at age 88. 2 paternal uncles and 2 paternal aunts both with a history of colon cancer.Marland Kitchen  HPI  Past Medical History:  Diagnosis Date  . Chicken pox   . Colon polyps   . Diabetes mellitus without complication ([place])   . Heart murmur    MVP slight  . Shingles    35  . Thyroid disease   . UTI (urinary tract infection)    HX    Past Surgical History:  Procedure Laterality Date  . COLONOSCOPY  2013  . TUBAL LIGATION      Family History  Problem Relation Age of Onset  . Stroke Mother        During surgery  . Cancer Mother 53       hodgkins  . Alcohol abuse Father   . Cancer Father 78       colon cancer age 54 and 64 prostate cancer  . Arthritis Maternal Grandmother   . Heart disease Maternal Grandmother     Social History Social History  Substance Use Topics  . Smoking status: Former Smoker    Packs/day: 0.50    Years: 35.00    Types: Cigarettes    Quit date: 05/03/2016  . Smokeless tobacco: Never Used  . Alcohol use 0.6 oz/week    1 Glasses of wine per week     Comment: rarely    Allergies  Allergen Reactions  . Ivp Dye [Iodinated Diagnostic Agents] Anaphylaxis  . Tetracyclines & Related Anaphylaxis    Current Outpatient Prescriptions  Medication Sig Dispense Refill  . atorvastatin (LIPITOR) 20 MG tablet Take 1 tablet (20 mg total) by mouth daily. 90 tablet 1  . diphenhydrAMINE (BENADRYL) 25 MG tablet Take 50 mg by mouth as needed for itching.     . losartan (COZAAR) 50 MG tablet Take 1 tablet (50 mg total) by mouth daily. 90 tablet 1  . Probiotic Product  (PROBIOTIC DAILY) CAPS Take 1 capsule by mouth daily.    . polyethylene glycol powder (GLYCOLAX/MIRALAX) powder Take 255 g by mouth once. Mix whole container with 64 ounces of clear liquids 255 g 0   No current facility-administered medications for this visit.     Review of Systems Review of Systems  Constitutional: Negative.   Respiratory: Negative.   Cardiovascular: Negative.     Blood pressure 122/70, pulse 91, resp. rate 12, height 5\' 7"  (1.702 m), weight 177 lb (80.3 kg).  Physical Exam Physical Exam  Constitutional: She is oriented to person, place, and time. She appears well-developed and well-nourished.  Cardiovascular: Normal rate, regular rhythm and normal heart sounds.   Pulmonary/Chest: Effort normal and breath sounds normal.  Neurological: She is alert and oriented to person, place, and time.  Skin: Skin is warm and dry.    Data Reviewed 2013 colonoscopy results requested, not available for review today.  Assessment    Family history colon cancer, personal history colonic polyps.    Plan         Colonoscopy with possible biopsy/polypectomy prn: Information regarding the procedure, including its  potential risks and complications (including but not limited to perforation of the bowel, which may require emergency surgery to repair, and bleeding) was verbally given to the patient. Educational information regarding lower intestinal endoscopy was given to the patient. Written instructions for how to complete the bowel prep using Miralax were provided. The importance of drinking ample fluids to avoid dehydration as a result of the prep emphasized.  HPI, Physical Exam, Assessment and Plan have been scribed under the direction and in the presence of Hervey Ard, MD.  Gaspar Cola, CMA  I have completed the exam and reviewed the above documentation for accuracy and completeness.  I agree with the above.  Haematologist has been used and any errors in dictation  or transcription are unintentional.  Hervey Ard, M.D., F.A.C.S.  The patient is scheduled for a Colonoscopy at [facility] on 04/12/17. They are aware to call the day before to get their arrival time. Miralax prescription has been sent into the patient's pharmacy. The patient is aware of date and instructions.  Documented by Lesly Rubenstein LPN       Robert Bellow 03/14/2017, 2:48 PM

## 2017-03-14 NOTE — Patient Instructions (Addendum)
Colonoscopy, Adult A colonoscopy is an exam to look at the entire large intestine. During the exam, a lubricated, bendable tube is inserted into the anus and then passed into the rectum, colon, and other parts of the large intestine. A colonoscopy is often done as a part of normal colorectal screening or in response to certain symptoms, such as anemia, persistent diarrhea, abdominal pain, and blood in the stool. The exam can help screen for and diagnose medical problems, including:  Tumors.  Polyps.  Inflammation.  Areas of bleeding.  Tell a health care provider about:  Any allergies you have.  All medicines you are taking, including vitamins, herbs, eye drops, creams, and over-the-counter medicines.  Any problems you or family members have had with anesthetic medicines.  Any blood disorders you have.  Any surgeries you have had.  Any medical conditions you have.  Any problems you have had passing stool. What are the risks? Generally, this is a safe procedure. However, problems may occur, including:  Bleeding.  A tear in the intestine.  A reaction to medicines given during the exam.  Infection (rare).  What happens before the procedure? Eating and drinking restrictions Follow instructions from your health care provider about eating and drinking, which may include:  A few days before the procedure - follow a low-fiber diet. Avoid nuts, seeds, dried fruit, raw fruits, and vegetables.  1-3 days before the procedure - follow a clear liquid diet. Drink only clear liquids, such as clear broth or bouillon, black coffee or tea, clear juice, clear soft drinks or sports drinks, gelatin dessert, and popsicles. Avoid any liquids that contain red or purple dye.  On the day of the procedure - do not eat or drink anything during the 2 hours before the procedure, or within the time period that your health care provider recommends.  Bowel prep If you were prescribed an oral bowel prep  to clean out your colon:  Take it as told by your health care provider. Starting the day before your procedure, you will need to drink a large amount of medicated liquid. The liquid will cause you to have multiple loose stools until your stool is almost clear or light green.  If your skin or anus gets irritated from diarrhea, you may use these to relieve the irritation: ? Medicated wipes, such as adult wet wipes with aloe and vitamin E. ? A skin soothing-product like petroleum jelly.  If you vomit while drinking the bowel prep, take a break for up to 60 minutes and then begin the bowel prep again. If vomiting continues and you cannot take the bowel prep without vomiting, call your health care provider.  General instructions  Ask your health care provider about changing or stopping your regular medicines. This is especially important if you are taking diabetes medicines or blood thinners.  Plan to have someone take you home from the hospital or clinic. What happens during the procedure?  An IV tube may be inserted into one of your veins.  You will be given medicine to help you relax (sedative).  To reduce your risk of infection: ? Your health care team will wash or sanitize their hands. ? Your anal area will be washed with soap.  You will be asked to lie on your side with your knees bent.  Your health care provider will lubricate a long, thin, flexible tube. The tube will have a camera and a light on the end.  The tube will be inserted into your   anus.  The tube will be gently eased through your rectum and colon.  Air will be delivered into your colon to keep it open. You may feel some pressure or cramping.  The camera will be used to take images during the procedure.  A small tissue sample may be removed from your body to be examined under a microscope (biopsy). If any potential problems are found, the tissue will be sent to a lab for testing.  If small polyps are found, your  health care provider may remove them and have them checked for cancer cells.  The tube that was inserted into your anus will be slowly removed. The procedure may vary among health care providers and hospitals. What happens after the procedure?  Your blood pressure, heart rate, breathing rate, and blood oxygen level will be monitored until the medicines you were given have worn off.  Do not drive for 24 hours after the exam.  You may have a small amount of blood in your stool.  You may pass gas and have mild abdominal cramping or bloating due to the air that was used to inflate your colon during the exam.  It is up to you to get the results of your procedure. Ask your health care provider, or the department performing the procedure, when your results will be ready. This information is not intended to replace advice given to you by your health care provider. Make sure you discuss any questions you have with your health care provider. Document Released: 08/05/2000 Document Revised: 06/08/2016 Document Reviewed: 10/20/2015 Elsevier Interactive Patient Education  [employer].  The patient is scheduled for a Colonoscopy at [facility] on 04/12/17. They are aware to call the day before to get their arrival time. Miralax prescription has been sent into the patient's pharmacy. The patient is aware of date and instructions.

## 2017-04-12 ENCOUNTER — Ambulatory Visit
Admission: RE | Admit: 2017-04-12 | Discharge: 2017-04-12 | Disposition: A | Discharge status: Home / Self Care | Payer: Medicare Other | Source: Ambulatory Visit | Attending: General Surgery | Admitting: General Surgery

## 2017-04-12 ENCOUNTER — Encounter: Payer: Self-pay | Admitting: Anesthesiology

## 2017-04-12 ENCOUNTER — Ambulatory Visit: Payer: Medicare Other | Admitting: Anesthesiology

## 2017-04-12 ENCOUNTER — Encounter
Admission: RE | Disposition: A | Discharge status: Home / Self Care | Payer: Self-pay | Source: Ambulatory Visit | Attending: General Surgery

## 2017-04-12 DIAGNOSIS — K5790 Diverticulosis of intestine, part unspecified, without perforation or abscess without bleeding: Secondary | ICD-10-CM | POA: Diagnosis not present

## 2017-04-12 DIAGNOSIS — Z79899 Other long term (current) drug therapy: Secondary | ICD-10-CM | POA: Insufficient documentation

## 2017-04-12 DIAGNOSIS — K5730 Diverticulosis of large intestine without perforation or abscess without bleeding: Secondary | ICD-10-CM | POA: Diagnosis not present

## 2017-04-12 DIAGNOSIS — I10 Essential (primary) hypertension: Secondary | ICD-10-CM | POA: Diagnosis not present

## 2017-04-12 DIAGNOSIS — Z87891 Personal history of nicotine dependence: Secondary | ICD-10-CM | POA: Diagnosis not present

## 2017-04-12 DIAGNOSIS — E119 Type 2 diabetes mellitus without complications: Secondary | ICD-10-CM | POA: Insufficient documentation

## 2017-04-12 DIAGNOSIS — Z86010 Personal history of colonic polyps: Secondary | ICD-10-CM

## 2017-04-12 DIAGNOSIS — Z800 Family history of malignant neoplasm of digestive organs: Secondary | ICD-10-CM | POA: Insufficient documentation

## 2017-04-12 DIAGNOSIS — Z1211 Encounter for screening for malignant neoplasm of colon: Secondary | ICD-10-CM | POA: Insufficient documentation

## 2017-04-12 HISTORY — PX: COLONOSCOPY WITH PROPOFOL: SHX5780

## 2017-04-12 SURGERY — COLONOSCOPY WITH PROPOFOL
Anesthesia: General

## 2017-04-12 MED ORDER — SODIUM CHLORIDE 0.9 % IV SOLN
INTRAVENOUS | Status: DC
  Administered 2017-04-12: 1000 mL via INTRAVENOUS

## 2017-04-12 MED ORDER — PROPOFOL 500 MG/50ML IV EMUL
INTRAVENOUS | Status: DC | PRN
Start: 2017-04-12 — End: 2017-04-12
  Administered 2017-04-12: 150 ug/kg/min via INTRAVENOUS

## 2017-04-12 MED ORDER — PROPOFOL 500 MG/50ML IV EMUL
INTRAVENOUS | Status: AC
  Filled 2017-04-12: qty 50

## 2017-04-12 MED ORDER — PHENYLEPHRINE HCL 10 MG/ML IJ SOLN
INTRAMUSCULAR | Status: DC | PRN
  Administered 2017-04-12: 200 ug via INTRAVENOUS

## 2017-04-12 MED ORDER — LIDOCAINE HCL (PF) 2 % IJ SOLN
INTRAMUSCULAR | Status: AC
  Filled 2017-04-12: qty 2

## 2017-04-12 MED ORDER — LIDOCAINE HCL (CARDIAC) 20 MG/ML IV SOLN
INTRAVENOUS | Status: DC | PRN
  Administered 2017-04-12: 100 mg via INTRAVENOUS

## 2017-04-12 MED ORDER — PROPOFOL 10 MG/ML IV BOLUS
INTRAVENOUS | Status: DC | PRN
  Administered 2017-04-12: 80 mg via INTRAVENOUS

## 2017-04-12 NOTE — H&P (Signed)
No change in clinical history or exam. Reports tolerating the prep well. For colonoscopy.

## 2017-04-12 NOTE — Anesthesia Postprocedure Evaluation (Signed)
Anesthesia Post Note  Patient: Amanda Everett  Procedure(s) Performed: Procedure(s) (LRB): COLONOSCOPY WITH PROPOFOL (N/A)  Patient location during evaluation: Endoscopy Anesthesia Type: General Level of consciousness: awake and alert Pain management: pain level controlled Vital Signs Assessment: post-procedure vital signs reviewed and stable Respiratory status: spontaneous breathing, nonlabored ventilation, respiratory function stable and patient connected to nasal cannula oxygen Cardiovascular status: blood pressure returned to baseline and stable Postop Assessment: no signs of nausea or vomiting Anesthetic complications: no     Last Vitals:  Vitals:   04/12/17 1055 04/12/17 1105  BP: 102/62 103/75  Pulse: (!) 52 (!) 56  Resp: 13 19  Temp:    SpO2: 100% 99%    Last Pain:  Vitals:   04/12/17 1035  TempSrc: Tympanic                 Martha Clan

## 2017-04-12 NOTE — Op Note (Signed)
[facility] Gastroenterology Patient Name: Amanda Everett Procedure Date: 04/12/2017 9:42 AM MRN: [id number] Account #: [id number] Date of Birth: Mar 23, 1951 Admit Type: Outpatient Age: 66 Room: [facility] ENDO ROOM 1 Gender: Female Note Status: Finalized Procedure:            Colonoscopy Indications:          Family history of colon cancer in a first-degree                        relative Providers:            Robert Bellow, MD Referring MD:         Deborra Medina, MD (Referring MD) Medicines:            Monitored Anesthesia Care Complications:        No immediate complications. Procedure:            Pre-Anesthesia Assessment:                       - Prior to the procedure, a History and Physical was                        performed, and patient medications, allergies and                        sensitivities were reviewed. The patient's tolerance of                        previous anesthesia was reviewed.                       - The risks and benefits of the procedure and the                        sedation options and risks were discussed with the                        patient. All questions were answered and informed                        consent was obtained.                       After obtaining informed consent, the colonoscope was                        passed under direct vision. Throughout the procedure,                        the patient's blood pressure, pulse, and oxygen                        saturations were monitored continuously. The                        Colonoscope was introduced through the anus and                        advanced to the the cecum, identified by appendiceal  orifice and ileocecal valve. The colonoscopy was                        somewhat difficult due to multiple diverticula in the                        colon. The patient tolerated the procedure well. The                        quality of the bowel  preparation was good. Findings:      Multiple medium-mouthed diverticula were found in the sigmoid colon.      The retroflexed view of the distal rectum and anal verge was normal and       showed no anal or rectal abnormalities. Impression:           - Diverticulosis in the sigmoid colon.                       - The distal rectum and anal verge are normal on                        retroflexion view.                       - No specimens collected. Recommendation:       - Repeat colonoscopy in 5 years for surveillance. Procedure Code(s):    --- Professional ---                       [id number], Colonoscopy, flexible; diagnostic, including                        collection of specimen(s) by brushing or washing, when                        performed (separate procedure) Diagnosis Code(s):    --- Professional ---                       Z80.0, Family history of malignant neoplasm of                        digestive organs                       K57.30, Diverticulosis of large intestine without                        perforation or abscess without bleeding CPT copyright 2016 American Medical Association. All rights reserved. The codes documented in this report are preliminary and upon coder review may  be revised to meet current compliance requirements. Robert Bellow, MD 04/12/2017 10:34:11 AM This report has been signed electronically. Number of Addenda: 0 Note Initiated On: 04/12/2017 9:42 AM Scope Withdrawal Time: 0 hours 10 minutes 37 seconds  Total Procedure Duration: 0 hours 22 minutes 54 seconds       [facility]

## 2017-04-12 NOTE — Transfer of Care (Signed)
Immediate Anesthesia Transfer of Care Note  Patient: Amanda Everett  Procedure(s) Performed: Procedure(s): COLONOSCOPY WITH PROPOFOL (N/A)  Patient Location: Endoscopy Unit  Anesthesia Type:General  Level of Consciousness: awake  Airway & Oxygen Therapy: Patient Spontanous Breathing and Patient connected to nasal cannula oxygen  Post-op Assessment: Report given to RN and Post -op Vital signs reviewed and stable  Post vital signs: Reviewed and stable  Last Vitals:  Vitals:   04/12/17 0930 04/12/17 1035  BP: 133/72   Pulse: 63 (!) 57  Resp: 16 16  Temp: 36.7 C (!) 36.4 C  SpO2: 100%     Last Pain:  Vitals:   04/12/17 1035  TempSrc: Tympanic         Complications: No apparent anesthesia complications

## 2017-04-12 NOTE — Anesthesia Preprocedure Evaluation (Signed)
Anesthesia Evaluation  Patient identified by MRN, date of birth, ID band Patient awake    Reviewed: Allergy & Precautions, H&P , NPO status , Patient's Chart, lab work & pertinent test results, reviewed documented beta blocker date and time   History of Anesthesia Complications Negative for: history of anesthetic complications  Airway Mallampati: III  TM Distance: >3 FB Neck ROM: full    Dental  (+) Caps, Dental Advidsory Given, Missing   Pulmonary neg pulmonary ROS, former smoker,           Cardiovascular Exercise Tolerance: Good hypertension, On Medications (-) angina(-) CAD, (-) Past MI, (-) Cardiac Stents and (-) CABG (-) dysrhythmias + Valvular Problems/Murmurs MVP      Neuro/Psych negative neurological ROS  negative psych ROS   GI/Hepatic negative GI ROS, Neg liver ROS,   Endo/Other  diabetes (borderline)  Renal/GU negative Renal ROS  negative genitourinary   Musculoskeletal   Abdominal   Peds  Hematology negative hematology ROS (+)   Anesthesia Other Findings Past Medical History: No date: Chicken pox No date: Colon polyps No date: Diabetes mellitus without complication (HCC) No date: Heart murmur     Comment:  MVP slight No date: Shingles     Comment:  35 No date: Thyroid disease No date: UTI (urinary tract infection)     Comment:  HX   Reproductive/Obstetrics negative OB ROS                             Anesthesia Physical Anesthesia Plan  ASA: II  Anesthesia Plan: General   Post-op Pain Management:    Induction: Intravenous  PONV Risk Score and Plan: 3 and Propofol infusion  Airway Management Planned: Natural Airway and Nasal Cannula  Additional Equipment:   Intra-op Plan:   Post-operative Plan:   Informed Consent: I have reviewed the patients History and Physical, chart, labs and discussed the procedure including the risks, benefits and alternatives for  the proposed anesthesia with the patient or authorized representative who has indicated his/her understanding and acceptance.   Dental Advisory Given  Plan Discussed with: Anesthesiologist, CRNA and Surgeon  Anesthesia Plan Comments:         Anesthesia Quick Evaluation

## 2017-04-12 NOTE — Anesthesia Post-op Follow-up Note (Signed)
Anesthesia QCDR form completed.        

## 2017-04-13 ENCOUNTER — Encounter: Payer: Self-pay | Admitting: General Surgery

## 2017-04-13 ENCOUNTER — Other Ambulatory Visit: Payer: Self-pay | Admitting: Internal Medicine

## 2017-04-29 ENCOUNTER — Encounter: Payer: Self-pay | Admitting: General Surgery

## 2017-04-29 NOTE — Progress Notes (Signed)
Records from [place] relating to her September 14, 3011 colonoscopy completed by Edwin Cap, M.D. were reviewed. A semi-pedunculated polyp 4 mm in diameter was identified in the colon at 20 cm. This was a tubular adenoma. A small polyp at 30 cm was hyperplastic. Moderately severe diverticulosis was reported throughout the colon. Repeat exam in 5 years recommended.  2007 colonoscopy showed diverticulosis alone.  2002 colonoscopy showed pandiverticulosis.  The patient's most recent colonoscopy of 04/12/2017 showed multiple diverticuli primarily in the sigmoid colon. No polypoid lesions.  Plans are for a repeat exam in 5 years based on family history and identification of a tubular adenoma at the time of her 2013 exam.

## 2017-05-22 ENCOUNTER — Other Ambulatory Visit: Payer: Self-pay | Admitting: Internal Medicine

## 2017-05-29 DIAGNOSIS — Z23 Encounter for immunization: Secondary | ICD-10-CM | POA: Diagnosis not present

## 2017-10-10 ENCOUNTER — Other Ambulatory Visit: Payer: Self-pay | Admitting: Internal Medicine

## 2017-10-10 NOTE — Telephone Encounter (Signed)
Refilled: 04/13/2017 Last OV: 09/16/2016 Next OV: not scheduled

## 2017-10-11 MED ORDER — LOSARTAN POTASSIUM 50 MG PO TABS: ORAL_TABLET | ORAL | 1 refills | Status: DC

## 2017-10-11 NOTE — Telephone Encounter (Signed)
Pt scheduled for AWV with Denisa 11/07/17. Pt also needs refill of Losartan 50mg .  Please advise

## 2017-10-11 NOTE — Telephone Encounter (Signed)
Please advise 

## 2017-11-07 ENCOUNTER — Other Ambulatory Visit: Payer: Self-pay

## 2017-11-07 ENCOUNTER — Ambulatory Visit (INDEPENDENT_AMBULATORY_CARE_PROVIDER_SITE_OTHER): Payer: Medicare Other

## 2017-11-07 VITALS — BP 126/70 | HR 63 | Temp 98.4°F | Resp 14 | Ht 66.0 in | Wt 178.0 lb

## 2017-11-07 DIAGNOSIS — E119 Type 2 diabetes mellitus without complications: Secondary | ICD-10-CM | POA: Diagnosis not present

## 2017-11-07 DIAGNOSIS — Z23 Encounter for immunization: Secondary | ICD-10-CM | POA: Diagnosis not present

## 2017-11-07 DIAGNOSIS — I10 Essential (primary) hypertension: Secondary | ICD-10-CM | POA: Diagnosis not present

## 2017-11-07 DIAGNOSIS — E785 Hyperlipidemia, unspecified: Secondary | ICD-10-CM

## 2017-11-07 DIAGNOSIS — E559 Vitamin D deficiency, unspecified: Secondary | ICD-10-CM

## 2017-11-07 DIAGNOSIS — Z0000 Encounter for general adult medical examination without abnormal findings: Secondary | ICD-10-CM | POA: Diagnosis not present

## 2017-11-07 MED ORDER — LOSARTAN POTASSIUM 50 MG PO TABS: ORAL_TABLET | ORAL | 1 refills | Status: DC

## 2017-11-07 NOTE — Patient Instructions (Addendum)
  Amanda Everett , Thank you for taking time to come for your Medicare Wellness Visit. I appreciate your ongoing commitment to your health goals. Please review the following plan we discussed and let me know if I can assist you in the future.   Follow up with Dr. Derrel Nip as needed.    Bring a copy of your [place] and/or Living Will to be scanned into chart.  Have a great day!  These are the goals we discussed: Goals    . Increase physical activity     Swim for exercise       This is a list of the screening recommended for you and due dates:  Health Maintenance  Topic Date Due  . Eye exam for diabetics  06/07/1961  . Complete foot exam   09/01/2016  . Hemoglobin A1C  03/16/2017  . Mammogram  10/04/2018  . Colon Cancer Screening  04/12/2022  . Tetanus Vaccine  04/23/2024  . Flu Shot  Completed  . DEXA scan (bone density measurement)  Completed  .  Hepatitis C: One time screening is recommended by Center for Disease Control  (CDC) for  adults born from [birth year] through 1965.   Completed  . Pneumonia vaccines  Completed

## 2017-11-07 NOTE — Progress Notes (Signed)
Subjective:   Amanda Everett is a 67 y.o. female who presents for an Initial Medicare Annual Wellness Visit.  Review of Systems    No ROS.  Medicare Wellness Visit. Additional risk factors are reflected in the social history.  Cardiac Risk Factors include: advanced age (>59men, >51 women);hypertension;diabetes mellitus;smoking/ tobacco exposure     Objective:    Today's Vitals   11/07/17 0834  BP: 126/70  Pulse: 63  Resp: 14  Temp: 98.4 F (36.9 C)  TempSrc: Oral  SpO2: 98%  Weight: 178 lb (80.7 kg)  Height: 5\' 6"  (1.676 m)   Body mass index is 28.73 kg/m.  Advanced Directives 11/07/2017 04/12/2017  Does Patient Have a Medical Advance Directive? No No  Would patient like information on creating a medical advance directive? No - Patient declined -    Current Medications (verified) Outpatient Encounter Medications as of 11/07/2017  Medication Sig  . aspirin EC 81 MG tablet Take 81 mg by mouth daily.  Marland Kitchen atorvastatin (LIPITOR) 20 MG tablet TAKE 1 TABLET DAILY  . Cholecalciferol (VITAMIN D3) 3000 units TABS Take 1,000 capsules by mouth.  . diphenhydrAMINE (BENADRYL) 25 MG tablet Take 50 mg by mouth as needed for itching.   . losartan (COZAAR) 50 MG tablet TAKE 1 TABLET DAILY DISCONTINUE 100MG   . nicotine (NICODERM CQ - DOSED IN MG/24 HOURS) 21 mg/24hr patch Place 21 mg onto the skin daily.  . Potassium 99 MG TABS Take 1 tablet by mouth.  . Probiotic Product (PROBIOTIC DAILY) CAPS Take 1 capsule by mouth daily.   No facility-administered encounter medications on file as of 11/07/2017.     Allergies (verified) Ivp dye [iodinated diagnostic agents] and Tetracyclines & related   History: Past Medical History:  Diagnosis Date  . Chicken pox   . Colon polyps   . Diabetes mellitus without complication ([place])   . Heart murmur    MVP slight  . Shingles    35  . Thyroid disease   . UTI (urinary tract infection)    HX   Past Surgical History:  Procedure Laterality Date    . COLONOSCOPY  2013  . COLONOSCOPY WITH PROPOFOL N/A 04/12/2017   Procedure: COLONOSCOPY WITH PROPOFOL;  Surgeon: Robert Bellow, MD;  Location: [facility] ENDOSCOPY;  Service: Endoscopy;  Laterality: N/A;  . TUBAL LIGATION     Family History  Problem Relation Age of Onset  . Stroke Mother        During surgery  . Cancer Mother 25       hodgkins  . Alcohol abuse Father   . Cancer Father 49       colon cancer age 22 and 43 prostate cancer  . Arthritis Maternal Grandmother   . Heart disease Maternal Grandmother    Social History   Socioeconomic History  . Marital status: Married    Spouse name: None  . Number of children: None  . Years of education: None  . Highest education level: None  Social Needs  . Financial resource strain: None  . Food insecurity - worry: None  . Food insecurity - inability: None  . Transportation needs - medical: None  . Transportation needs - non-medical: None  Occupational History  . Occupation: retired Marine scientist  Tobacco Use  . Smoking status: Former Smoker    Packs/day: 0.50    Years: 35.00    Pack years: 17.50    Types: Cigarettes    Last attempt to quit: 05/03/2016    Years since  quitting: 1.5  . Smokeless tobacco: Never Used  Substance and Sexual Activity  . Alcohol use: Yes    Alcohol/week: 0.6 oz    Types: 1 Glasses of wine per week    Comment: rarely  . Drug use: No  . Sexual activity: No  Other Topics Concern  . None  Social History Narrative  . None    Tobacco Counseling Counseling given: Not Answered   Clinical Intake:  Pre-visit preparation completed: Yes  Pain : No/denies pain     Nutritional Status: BMI 25 -29 Overweight Diabetes: Yes(Followed by PCP)  How often do you need to have someone help you when you read instructions, pamphlets, or other written materials from your doctor or pharmacy?: 1 - Never  Interpreter Needed?: No      Activities of Daily Living In your present state of health, do you have any  difficulty performing the following activities: 11/07/2017  Hearing? N  Vision? N  Difficulty concentrating or making decisions? N  Walking or climbing stairs? N  Dressing or bathing? N  Doing errands, shopping? N  Preparing Food and eating ? N  Using the Toilet? N  In the past six months, have you accidently leaked urine? N  Do you have problems with loss of bowel control? N  Managing your Medications? N  Managing your Finances? N  Housekeeping or managing your Housekeeping? N  Some recent data might be hidden     Immunizations and Health Maintenance Immunization History  Administered Date(s) Administered  . Influenza,inj,Quad PF,6+ Mos 07/03/2014, 09/02/2015  . Influenza-Unspecified 07/26/2016, 05/29/2017  . Pneumococcal Conjugate-13 11/07/2017  . Pneumococcal Polysaccharide-23 09/02/2015, 09/16/2016  . Tdap 04/23/2014   Health Maintenance Due  Topic Date Due  . OPHTHALMOLOGY EXAM  06/07/1961  . FOOT EXAM  09/01/2016  . HEMOGLOBIN A1C  03/16/2017  . PNA vac Low Risk Adult (2 of 2 - PCV13) 09/16/2017    Patient Care Team: Crecencio Mc, MD as PCP - General (Internal Medicine)  Indicate any recent Medical Services you may have received from other than [facility] providers in the past year (date may be approximate).     Assessment:   This is a routine wellness examination for Amanda Everett.  The goal of the wellness visit is to assist the patient how to close the gaps in care and create a preventative care plan for the patient.   The roster of all physicians providing medical care to patient is listed in the Snapshot section of the chart.  Osteoporosis risk reviewed.    Safety issues reviewed; Smoke and carbon monoxide detectors in the home. No firearms or firearms locked in a safe within the home. Wears seatbelts when driving or riding with others. No violence in the home.  They do not have excessive sun exposure.  Discussed the need for sun protection: hats, long sleeves and  the use of sunscreen if there is significant sun exposure.  Patient is alert, normal appearance, oriented to person/place/and time. Correctly identified the president of the [place] and recalls of 3/3 words.  Performs simple calculations and can read correct time from watch face. Displays appropriate judgement.  No new identified risk were noted.  No failures at ADL's or IADL's.    BMI- discussed the importance of a healthy diet, water intake and the benefits of aerobic exercise. Educational material provided.   24 hour diet recall: Regular diet  Dental- every 6 months.  Eye- Visual acuity not assessed per patient preference since they have  regular follow up with the ophthalmologist.  Wears corrective lenses.  Sleep patterns- Sleeps through the night without issues.    Prevnar 13 vaccine administered L deltoid, tolerated well. Educational material provided.  Fasting labs placed per request.  Requests results be sent to mychart.   BP home log viewed by PCP.  Losartan refilled.  Hearing/Vision screen Hearing Screening Comments: Patient is able to hear conversational tones without difficulty.  No issues reported.   Vision Screening Comments: Followed by Dr. Marvel Plan Wears corrective lenses Last OV 2018 Visual acuity not assessed per patient preference since they have regular follow up with the ophthalmologist  Dietary issues and exercise activities discussed: Current Exercise Habits: Home exercise routine, Type of exercise: walking, Time (Minutes): 60, Frequency (Times/Week): 5, Weekly Exercise (Minutes/Week): 300, Intensity: Moderate  Goals    . Increase physical activity     Swim for exercise      Depression Screen PHQ 2/9 Scores 11/07/2017  PHQ - 2 Score 0    Fall Risk Fall Risk  11/07/2017  Falls in the past year? No   Cognitive Function: MMSE - Mini Mental State Exam 11/07/2017  Orientation to time 5  Orientation to Place 5  Registration 3  Attention/ Calculation 5   Recall 3  Language- name 2 objects 2  Language- repeat 1  Language- follow 3 step command 3  Language- read & follow direction 1  Write a sentence 1  Copy design 1  Total score 30        Screening Tests Health Maintenance  Topic Date Due  . OPHTHALMOLOGY EXAM  06/07/1961  . FOOT EXAM  09/01/2016  . HEMOGLOBIN A1C  03/16/2017  . PNA vac Low Risk Adult (2 of 2 - PCV13) 09/16/2017  . MAMMOGRAM  10/04/2018  . COLONOSCOPY  04/12/2022  . TETANUS/TDAP  04/23/2024  . INFLUENZA VACCINE  Completed  . DEXA SCAN  Completed  . Hepatitis C Screening  Completed       Plan:   End of life planning; Advanced aging; Advanced directives discussed.  No HCPOA/Living Will.  Additional information declined at this time.  I have personally reviewed and noted the following in the patient's chart:   . Medical and social history . Use of alcohol, tobacco or illicit drugs  . Current medications and supplements . Functional ability and status . Nutritional status . Physical activity . Advanced directives . List of other physicians . Hospitalizations, surgeries, and ER visits in previous 12 months . Vitals . Screenings to include cognitive, depression, and falls . Referrals and appointments  In addition, I have reviewed and discussed with patient certain preventive protocols, quality metrics, and best practice recommendations. A written personalized care plan for preventive services as well as general preventive health recommendations were provided to patient.     Varney Biles, LPN   5/62/1308    Reviewed above information.  Agree with assessment and plan.    Dr Nicki Reaper

## 2017-11-09 ENCOUNTER — Other Ambulatory Visit: Payer: Self-pay

## 2017-11-09 DIAGNOSIS — Z1239 Encounter for other screening for malignant neoplasm of breast: Secondary | ICD-10-CM

## 2017-11-09 NOTE — Progress Notes (Signed)
Please relay this message to the patient if she calls in reference to her mammogram.  Annual mammogram referral placed today per primary care physician recommendation.    [facility] [phone] should contact the patient to schedule an exam within 1-2 weeks or the patient may choose to contact them directly at the number provided.  Patient may choose to schedule or decline this exam however it is a preferred annual screening.

## 2017-11-15 ENCOUNTER — Other Ambulatory Visit (INDEPENDENT_AMBULATORY_CARE_PROVIDER_SITE_OTHER): Payer: Medicare Other

## 2017-11-15 DIAGNOSIS — E119 Type 2 diabetes mellitus without complications: Secondary | ICD-10-CM | POA: Diagnosis not present

## 2017-11-15 DIAGNOSIS — I10 Essential (primary) hypertension: Secondary | ICD-10-CM | POA: Diagnosis not present

## 2017-11-15 DIAGNOSIS — E559 Vitamin D deficiency, unspecified: Secondary | ICD-10-CM | POA: Diagnosis not present

## 2017-11-15 DIAGNOSIS — E785 Hyperlipidemia, unspecified: Secondary | ICD-10-CM | POA: Diagnosis not present

## 2017-11-15 LAB — LDL CHOLESTEROL, DIRECT: Direct LDL: 84 mg/dL

## 2017-11-15 LAB — CBC WITH DIFFERENTIAL/PLATELET
BASOS ABS: 0 10*3/uL (ref 0.0–0.1)
Basophils Relative: 0.4 % (ref 0.0–3.0)
Eosinophils Absolute: 0.2 10*3/uL (ref 0.0–0.7)
Eosinophils Relative: 2.4 % (ref 0.0–5.0)
HEMATOCRIT: 40.5 % (ref 36.0–46.0)
Hemoglobin: 13.5 g/dL (ref 12.0–15.0)
LYMPHS ABS: 2.2 10*3/uL (ref 0.7–4.0)
LYMPHS PCT: 34.2 % (ref 12.0–46.0)
MCHC: 33.5 g/dL (ref 30.0–36.0)
MCV: 93.8 fl (ref 78.0–100.0)
MONO ABS: 0.4 10*3/uL (ref 0.1–1.0)
Monocytes Relative: 6 % (ref 3.0–12.0)
NEUTROS ABS: 3.7 10*3/uL (ref 1.4–7.7)
NEUTROS PCT: 57 % (ref 43.0–77.0)
PLATELETS: 203 10*3/uL (ref 150.0–400.0)
RBC: 4.31 Mil/uL (ref 3.87–5.11)
RDW: 13.1 % (ref 11.5–15.5)
WBC: 6.5 10*3/uL (ref 4.0–10.5)

## 2017-11-15 LAB — COMPREHENSIVE METABOLIC PANEL
ALBUMIN: 4 g/dL (ref 3.5–5.2)
ALT: 27 U/L (ref 0–35)
AST: 27 U/L (ref 0–37)
Alkaline Phosphatase: 36 U/L — ABNORMAL LOW (ref 39–117)
BUN: 17 mg/dL (ref 6–23)
CHLORIDE: 104 meq/L (ref 96–112)
CO2: 28 meq/L (ref 19–32)
Calcium: 9 mg/dL (ref 8.4–10.5)
Creatinine, Ser: 0.71 mg/dL (ref 0.40–1.20)
GFR: 87.42 mL/min (ref >60.00)
Glucose, Bld: 112 mg/dL — ABNORMAL HIGH (ref 70–99)
POTASSIUM: 3.9 meq/L (ref 3.5–5.1)
SODIUM: 141 meq/L (ref 135–145)
Total Bilirubin: 0.7 mg/dL (ref 0.2–1.2)
Total Protein: 7.1 g/dL (ref 6.0–8.3)

## 2017-11-15 LAB — LIPID PANEL
CHOLESTEROL: 154 mg/dL (ref 0–200)
HDL: 60.3 mg/dL (ref >39.00)
LDL CALC: 77 mg/dL (ref 0–99)
NonHDL: 93.44
Total CHOL/HDL Ratio: 3
Triglycerides: 82 mg/dL (ref 0.0–149.0)
VLDL: 16.4 mg/dL (ref 0.0–40.0)

## 2017-11-15 LAB — VITAMIN D 25 HYDROXY (VIT D DEFICIENCY, FRACTURES): VITD: 35.19 ng/mL (ref 30.00–100.00)

## 2017-11-15 LAB — HEMOGLOBIN A1C: Hgb A1c MFr Bld: 6.3 % (ref 4.6–6.5)

## 2018-04-09 ENCOUNTER — Other Ambulatory Visit: Payer: Self-pay | Admitting: Internal Medicine

## 2018-04-25 ENCOUNTER — Telehealth: Payer: Self-pay | Admitting: Internal Medicine

## 2018-04-25 NOTE — Telephone Encounter (Signed)
Attempted to contact pt regarding prescription refills (attemtping to clarify medication and dosage; pt chart show that atorvastatin was refilled #90 on 04/10/18, but cozaar #90 was refilled on 11/07/17; no answer at [phone].

## 2018-04-25 NOTE — Telephone Encounter (Signed)
Copied from [place] [id number]. Topic: Quick Communication - Rx Refill/Question >> Apr 25, 2018 11:26 AM Sheran Luz wrote: Medication: losartan (COZAAR) 50 MG tablet [[id number]] AND atorvastatin (LIPITOR) 20 MG tablet [[id number]]    Pt is requesting these medications be partially filled so she will have enough to get her to her next appointment on 10/11. Pt states she has enough for this month but will be out before 10/11.  Preferred Pharmacy (with phone number or street name): [facility] - [address], [place] [phone] (Phone) [phone] (Fax)

## 2018-04-26 MED ORDER — ATORVASTATIN CALCIUM 20 MG PO TABS: 20.0000 mg | ORAL_TABLET | Freq: Every day | ORAL | 0 refills | Status: DC

## 2018-04-26 MED ORDER — LOSARTAN POTASSIUM 50 MG PO TABS: ORAL_TABLET | ORAL | 0 refills | Status: DC

## 2018-04-26 NOTE — Telephone Encounter (Signed)
LMTCB. Need to let pt know that a 30 day supply of both the atorvastatin and losartan have been sent in to her local pharmacy [facility] on [address] Pt must keep appt on 06/01/2018 in order to keep getting refills. [facility] may speak with pt.

## 2018-05-31 DIAGNOSIS — M79671 Pain in right foot: Secondary | ICD-10-CM | POA: Diagnosis not present

## 2018-06-01 ENCOUNTER — Encounter: Payer: Self-pay | Admitting: Internal Medicine

## 2018-06-01 ENCOUNTER — Ambulatory Visit (INDEPENDENT_AMBULATORY_CARE_PROVIDER_SITE_OTHER): Payer: Medicare Other | Admitting: Internal Medicine

## 2018-06-01 ENCOUNTER — Encounter

## 2018-06-01 VITALS — BP 126/72 | HR 70 | Temp 98.4°F | Resp 14 | Ht 64.5 in | Wt 172.8 lb

## 2018-06-01 DIAGNOSIS — R7989 Other specified abnormal findings of blood chemistry: Secondary | ICD-10-CM

## 2018-06-01 DIAGNOSIS — E039 Hypothyroidism, unspecified: Secondary | ICD-10-CM | POA: Diagnosis not present

## 2018-06-01 DIAGNOSIS — E785 Hyperlipidemia, unspecified: Secondary | ICD-10-CM | POA: Diagnosis not present

## 2018-06-01 DIAGNOSIS — E119 Type 2 diabetes mellitus without complications: Secondary | ICD-10-CM | POA: Diagnosis not present

## 2018-06-01 DIAGNOSIS — Z1239 Encounter for other screening for malignant neoplasm of breast: Secondary | ICD-10-CM

## 2018-06-01 DIAGNOSIS — M79671 Pain in right foot: Secondary | ICD-10-CM | POA: Diagnosis not present

## 2018-06-01 DIAGNOSIS — I10 Essential (primary) hypertension: Secondary | ICD-10-CM | POA: Diagnosis not present

## 2018-06-01 DIAGNOSIS — R29890 Loss of height: Secondary | ICD-10-CM

## 2018-06-01 LAB — TSH: TSH: 3.52 m[IU]/L (ref 0.40–4.50)

## 2018-06-01 NOTE — Patient Instructions (Signed)
I have ordered your mammogram,  Your spine x rays and your  right foot x ray    I agree with taking Aleve twice daily

## 2018-06-01 NOTE — Progress Notes (Signed)
Subjective:  Patient ID: Amanda Everett, female    DOB: 1951-01-02  Age: 67 y.o. MRN: [id number]  CC: The primary encounter diagnosis was Essential hypertension. Diagnoses of Hyperlipidemia LDL goal <100, Elevated TSH, Diabetes mellitus type 2, diet-controlled ([place]), Foot pain, right, Breast cancer screening, Loss of height, and Hypothyroidism, unspecified type were also pertinent to this visit.  HPI Amanda Everett presents for follow up on hyperlipidemia,  Hypertension   She has been having moderate to severe right foot pain for 2 months ,  Saw podiatry yesterday,  No diagnosis given and no films were done.   Pain is usually on the plantar surace of her mid foot and is brought on by weight bearing and swimming.  Not  Similar to previously experienced pain of plantar fasciitis   Outpatient Medications Prior to Visit  Medication Sig Dispense Refill  . aspirin EC 81 MG tablet Take 81 mg by mouth daily.    Marland Kitchen atorvastatin (LIPITOR) 20 MG tablet Take 1 tablet (20 mg total) by mouth daily. 30 tablet 0  . Cholecalciferol (VITAMIN D3) 3000 units TABS Take 1,000 Units by mouth daily.     . diphenhydrAMINE (BENADRYL) 25 MG tablet Take 50 mg by mouth as needed for itching.     . losartan (COZAAR) 50 MG tablet TAKE 1 TABLET DAILY DISCONTINUE 100MG 30 tablet 0  . nicotine (NICODERM CQ - DOSED IN MG/24 HOURS) 21 mg/24hr patch Place 21 mg onto the skin daily.    . Probiotic Product (PROBIOTIC DAILY) CAPS Take 1 capsule by mouth daily.     No facility-administered medications prior to visit.     Review of Systems;  Patient denies headache, fevers, malaise, unintentional weight loss, skin rash, eye pain, sinus congestion and sinus pain, sore throat, dysphagia,  hemoptysis , cough, dyspnea, wheezing, chest pain, palpitations, orthopnea, edema, abdominal pain, nausea, melena, diarrhea, constipation, flank pain, dysuria, hematuria, urinary  Frequency, nocturia, numbness, tingling, seizures,  Focal weakness,  Loss of consciousness,  Tremor, insomnia, depression, anxiety, and suicidal ideation.      Objective:  BP 126/72 (BP Location: Left Arm, Patient Position: Sitting, Cuff Size: Normal)   Pulse 70   Temp 98.4 F (36.9 C) (Oral)   Resp 14   Ht 5' 4.5" (1.638 m)   Wt 172 lb 12.8 oz (78.4 kg)   SpO2 99%   BMI 29.20 kg/m   BP Readings from Last 3 Encounters:  06/01/18 126/72  11/07/17 126/70  04/12/17 103/75    Wt Readings from Last 3 Encounters:  06/01/18 172 lb 12.8 oz (78.4 kg)  11/07/17 178 lb (80.7 kg)  04/12/17 176 lb (79.8 kg)    General appearance: alert, cooperative and appears stated age Ears: normal TM's and external ear canals both ears Throat: lips, mucosa, and tongue normal; teeth and gums normal Neck: no adenopathy, no carotid bruit, supple, symmetrical, trachea midline and thyroid not enlarged, symmetric, no tenderness/mass/nodules Back: symmetric, no curvature. ROM normal. No CVA tenderness. Lungs: clear to auscultation bilaterally Heart: regular rate and rhythm, S1, S2 normal, no murmur, click, rub or gallop Abdomen: soft, non-tender; bowel sounds normal; no masses,  no organomegaly Pulses: 2+ and symmetric Skin: Skin color, texture, turgor normal. No rashes or lesions Lymph nodes: Cervical, supraclavicular, and axillary nodes normal.  Lab Results  Component Value Date   TSH 3.52 06/01/2018     Lab Results  Component Value Date   HGBA1C 6.2 (H) 06/01/2018   HGBA1C 6.3 11/15/2017   HGBA1C 5.9  09/16/2016    Lab Results  Component Value Date   CREATININE 0.77 06/01/2018   CREATININE 0.71 11/15/2017   CREATININE 0.79 09/16/2016    Lab Results  Component Value Date   WBC 6.5 11/15/2017   HGB 13.5 11/15/2017   HCT 40.5 11/15/2017   PLT 203.0 11/15/2017   GLUCOSE 85 06/01/2018   CHOL 154 11/15/2017   TRIG 82.0 11/15/2017   HDL 60.30 11/15/2017   LDLDIRECT 84.0 11/15/2017   LDLCALC 77 11/15/2017   ALT 19 06/01/2018   AST 21 06/01/2018   NA  142 06/01/2018   K 4.1 06/01/2018   CL 105 06/01/2018   CREATININE 0.77 06/01/2018   BUN 20 06/01/2018   CO2 28 06/01/2018   TSH 3.52 06/01/2018   HGBA1C 6.2 (H) 06/01/2018   MICROALBUR <0.7 09/02/2015    No results found.  Assessment & Plan:   Problem List Items Addressed This Visit    Diabetes mellitus type 2, diet-controlled ([place])    Currently well-controlled on diet alone .  hemoglobin A1c is at goal of less than 7.0 . Patient is reminded to schedule an annual eye exam and foot exam is normal today. Patient has no microalbuminuria. Patient is tolerating statin therapy for CAD risk reduction and on ACE/ARB for renal protection and hypertension.  Lab Results  Component Value Date   HGBA1C 6.2 (H) 06/01/2018   Lab Results  Component Value Date   MICROALBUR <0.7 09/02/2015          Relevant Orders   Hemoglobin A1c (Completed)   Comprehensive metabolic panel (Completed)   Microalbumin / creatinine urine ratio   Elevated TSH    Minimal, with only weight gain as the attributable symptoms.  Normal on repeat    Lab Results  Component Value Date   TSH 3.52 06/01/2018         Essential hypertension - Primary    Well controlled on current regimen. Renal function stable, no changes today.  Lab Results  Component Value Date   CREATININE 0.77 06/01/2018   Lab Results  Component Value Date   NA 142 06/01/2018   K 4.1 06/01/2018   CL 105 06/01/2018   CO2 28 06/01/2018         Foot pain, right    Etiology likely arthritis given normal ESR  Lab Results  Component Value Date   ESRSEDRATE 6 06/01/2018         Relevant Orders   Sedimentation rate (Completed)   Uric acid (Completed)   DG Foot Complete Right   Hyperlipidemia LDL goal <100     managed with atorvastatin,  She  has no side effects and liver enzymes are normal.   Lab Results  Component Value Date   CHOL 154 11/15/2017   HDL 60.30 11/15/2017   LDLCALC 77 11/15/2017   LDLDIRECT 84.0  11/15/2017   TRIG 82.0 11/15/2017   CHOLHDL 3 11/15/2017   Lab Results  Component Value Date   ALT 19 06/01/2018   AST 21 06/01/2018   ALKPHOS 36 (L) 11/15/2017   BILITOT 0.5 06/01/2018             Other Visit Diagnoses    Breast cancer screening       Relevant Orders   MM DIGITAL SCREENING BILATERAL   Loss of height       Relevant Orders   DG Lumbar Spine Complete   Hypothyroidism, unspecified type       Relevant Orders   TSH (  Completed)      I am having Amanda Everett maintain her diphenhydrAMINE, PROBIOTIC DAILY, aspirin EC, Vitamin D3, nicotine, atorvastatin, and losartan.  No orders of the defined types were placed in this encounter.   There are no discontinued medications.  Follow-up: Return for CPE.   Crecencio Mc, MD

## 2018-06-02 ENCOUNTER — Other Ambulatory Visit: Payer: Self-pay | Admitting: Internal Medicine

## 2018-06-03 DIAGNOSIS — M79671 Pain in right foot: Secondary | ICD-10-CM | POA: Insufficient documentation

## 2018-06-03 NOTE — Assessment & Plan Note (Signed)
Minimal, with only weight gain as the attributable symptoms.  Normal on repeat    Lab Results  Component Value Date   TSH 3.52 06/01/2018

## 2018-06-03 NOTE — Assessment & Plan Note (Signed)
managed with atorvastatin,  She  has no side effects and liver enzymes are normal.   Lab Results  Component Value Date   CHOL 154 11/15/2017   HDL 60.30 11/15/2017   LDLCALC 77 11/15/2017   LDLDIRECT 84.0 11/15/2017   TRIG 82.0 11/15/2017   CHOLHDL 3 11/15/2017   Lab Results  Component Value Date   ALT 19 06/01/2018   AST 21 06/01/2018   ALKPHOS 36 (L) 11/15/2017   BILITOT 0.5 06/01/2018

## 2018-06-03 NOTE — Assessment & Plan Note (Signed)
Well controlled on current regimen. Renal function stable, no changes today.  Lab Results  Component Value Date   CREATININE 0.77 06/01/2018   Lab Results  Component Value Date   NA 142 06/01/2018   K 4.1 06/01/2018   CL 105 06/01/2018   CO2 28 06/01/2018

## 2018-06-03 NOTE — Assessment & Plan Note (Signed)
Etiology likely arthritis given normal ESR  Lab Results  Component Value Date   ESRSEDRATE 6 06/01/2018

## 2018-06-03 NOTE — Assessment & Plan Note (Signed)
Currently well-controlled on diet alone .  hemoglobin A1c is at goal of less than 7.0 . Patient is reminded to schedule an annual eye exam and foot exam is normal today. Patient has no microalbuminuria. Patient is tolerating statin therapy for CAD risk reduction and on ACE/ARB for renal protection and hypertension.  Lab Results  Component Value Date   HGBA1C 6.2 (H) 06/01/2018   Lab Results  Component Value Date   MICROALBUR <0.7 09/02/2015

## 2018-06-04 LAB — HEMOGLOBIN A1C
HEMOGLOBIN A1C: 6.2 %{Hb} — AB (ref <5.7)
Mean Plasma Glucose: 131 (calc)
eAG (mmol/L): 7.3 (calc)

## 2018-06-04 LAB — SEDIMENTATION RATE: Sed Rate: 6 mm/h (ref 0–30)

## 2018-06-04 LAB — COMPREHENSIVE METABOLIC PANEL
AG Ratio: 1.6 (calc) (ref 1.0–2.5)
ALBUMIN MSPROF: 4.4 g/dL (ref 3.6–5.1)
ALKALINE PHOSPHATASE (APISO): 33 U/L (ref 33–130)
ALT: 19 U/L (ref 6–29)
AST: 21 U/L (ref 10–35)
BILIRUBIN TOTAL: 0.5 mg/dL (ref 0.2–1.2)
BUN: 20 mg/dL (ref 7–25)
CHLORIDE: 105 mmol/L (ref 98–110)
CO2: 28 mmol/L (ref 20–32)
CREATININE: 0.77 mg/dL (ref 0.50–0.99)
Calcium: 9.4 mg/dL (ref 8.6–10.4)
GLOBULIN: 2.7 g/dL (ref 1.9–3.7)
Glucose, Bld: 85 mg/dL (ref 65–99)
POTASSIUM: 4.1 mmol/L (ref 3.5–5.3)
SODIUM: 142 mmol/L (ref 135–146)
TOTAL PROTEIN: 7.1 g/dL (ref 6.1–8.1)

## 2018-06-04 LAB — MICROALBUMIN / CREATININE URINE RATIO

## 2018-06-04 LAB — URIC ACID: URIC ACID, SERUM: 4.9 mg/dL (ref 2.5–7.0)

## 2018-06-05 ENCOUNTER — Telehealth: Payer: Self-pay | Admitting: Radiology

## 2018-06-05 ENCOUNTER — Other Ambulatory Visit: Payer: Medicare Other

## 2018-06-05 ENCOUNTER — Ambulatory Visit (INDEPENDENT_AMBULATORY_CARE_PROVIDER_SITE_OTHER): Payer: Medicare Other

## 2018-06-05 DIAGNOSIS — E119 Type 2 diabetes mellitus without complications: Secondary | ICD-10-CM | POA: Diagnosis not present

## 2018-06-05 DIAGNOSIS — M19071 Primary osteoarthritis, right ankle and foot: Secondary | ICD-10-CM | POA: Diagnosis not present

## 2018-06-05 DIAGNOSIS — M79671 Pain in right foot: Secondary | ICD-10-CM | POA: Diagnosis not present

## 2018-06-05 DIAGNOSIS — I10 Essential (primary) hypertension: Secondary | ICD-10-CM | POA: Diagnosis not present

## 2018-06-05 DIAGNOSIS — R29890 Loss of height: Secondary | ICD-10-CM | POA: Diagnosis not present

## 2018-06-05 DIAGNOSIS — M47816 Spondylosis without myelopathy or radiculopathy, lumbar region: Secondary | ICD-10-CM | POA: Diagnosis not present

## 2018-06-05 DIAGNOSIS — M4186 Other forms of scoliosis, lumbar region: Secondary | ICD-10-CM | POA: Diagnosis not present

## 2018-06-05 DIAGNOSIS — M48061 Spinal stenosis, lumbar region without neurogenic claudication: Secondary | ICD-10-CM | POA: Diagnosis not present

## 2018-06-05 MED ORDER — LOSARTAN POTASSIUM 50 MG PO TABS: ORAL_TABLET | ORAL | 1 refills | Status: DC

## 2018-06-05 MED ORDER — ATORVASTATIN CALCIUM 20 MG PO TABS: ORAL_TABLET | ORAL | 1 refills | Status: DC

## 2018-06-05 NOTE — Addendum Note (Signed)
Addended by: Leeanne Rio on: 06/05/2018 03:26 PM   Modules accepted: Orders

## 2018-06-05 NOTE — Telephone Encounter (Signed)
Medications have been resent to [facility] mail order. Not sure why the pt brought a urine

## 2018-06-05 NOTE — Telephone Encounter (Signed)
PT came today to drop urine specimen and for xray. Pt has no urine orders. Please place future orders. Pt also stated during xray that she needed her medications to be sent to [facility]. Pt stated they were called in to [facility] and this is incorrect.

## 2018-06-05 NOTE — Addendum Note (Signed)
Addended by: Adair Laundry on: 06/05/2018 03:25 PM   Modules accepted: Orders

## 2018-06-05 NOTE — Addendum Note (Signed)
Addended by: Adair Laundry on: 06/05/2018 03:19 PM   Modules accepted: Orders

## 2018-06-05 NOTE — Telephone Encounter (Signed)
Lab Results  Component Value Date   MICROALBUR <0.7 09/02/2015

## 2018-06-05 NOTE — Telephone Encounter (Signed)
Spoke with pt and she stated that she was not able to give a sample when she was in the office so she was told to bring it back with her. Mircoalbumin was reordered.

## 2018-06-06 LAB — MICROALBUMIN / CREATININE URINE RATIO
Creatinine, Urine: 123.9 mg/dL
Microalb/Creat Ratio: 8.3 mg/g creat (ref 0.0–30.0)
Microalbumin, Urine: 10.3 ug/mL

## 2018-10-24 IMAGING — DX DG LUMBAR SPINE COMPLETE 4+V
5 series · 5 of 5 positions shown · non-contrast
Comparison: 07/03/2014 lumbar spine plain film exam.

CLINICAL DATA: 66-year-old female with pain in right foot and lower
back for several months. No injury. Initial encounter.

EXAM:
LUMBAR SPINE - COMPLETE 4+ VIEW

[lumbar spine ap]
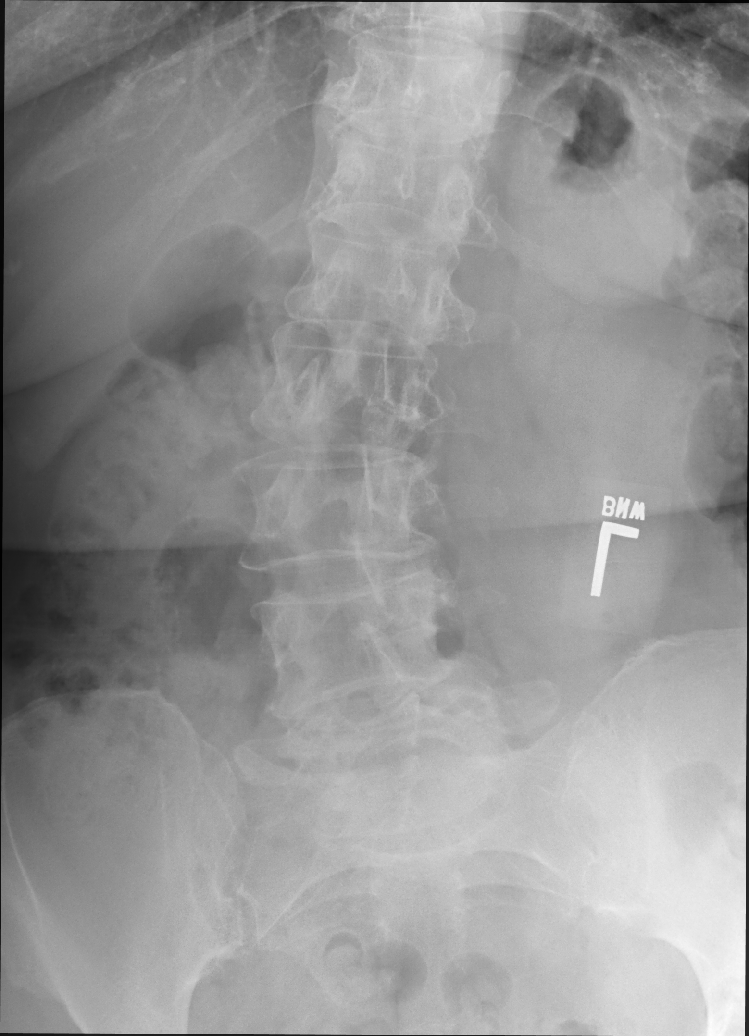

[lumbar spine obl (oblique) (1 of 2)]
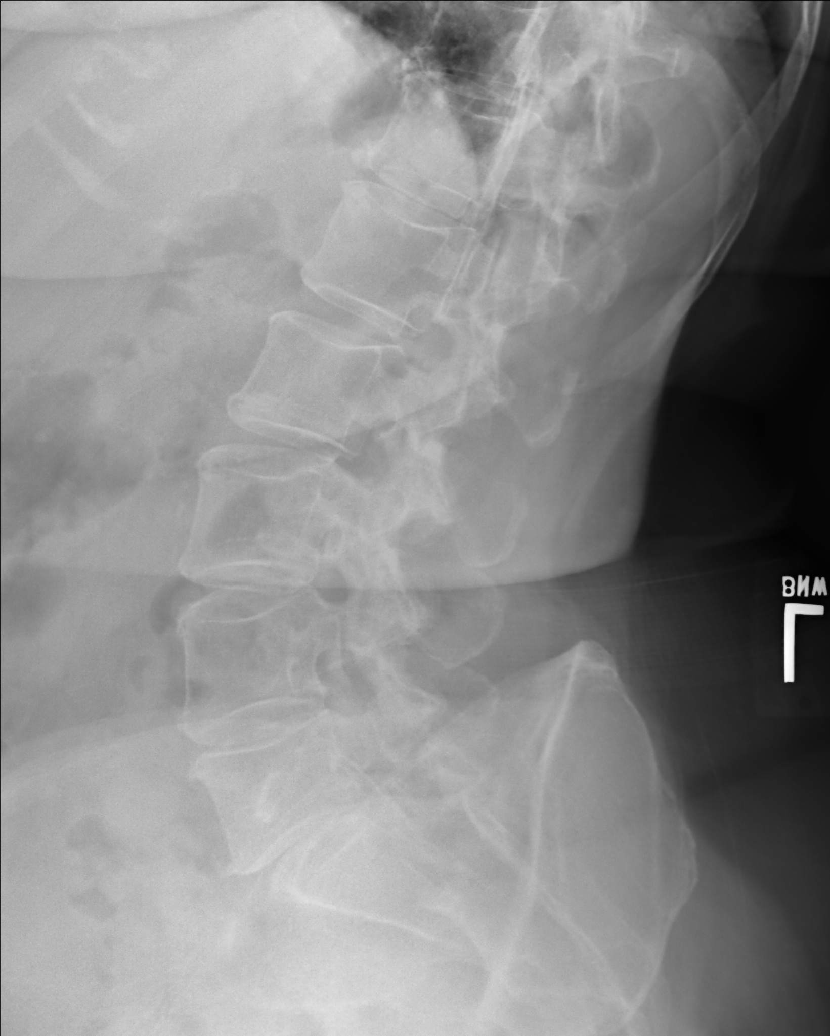

[lumbar spine obl (oblique) (2 of 2)]
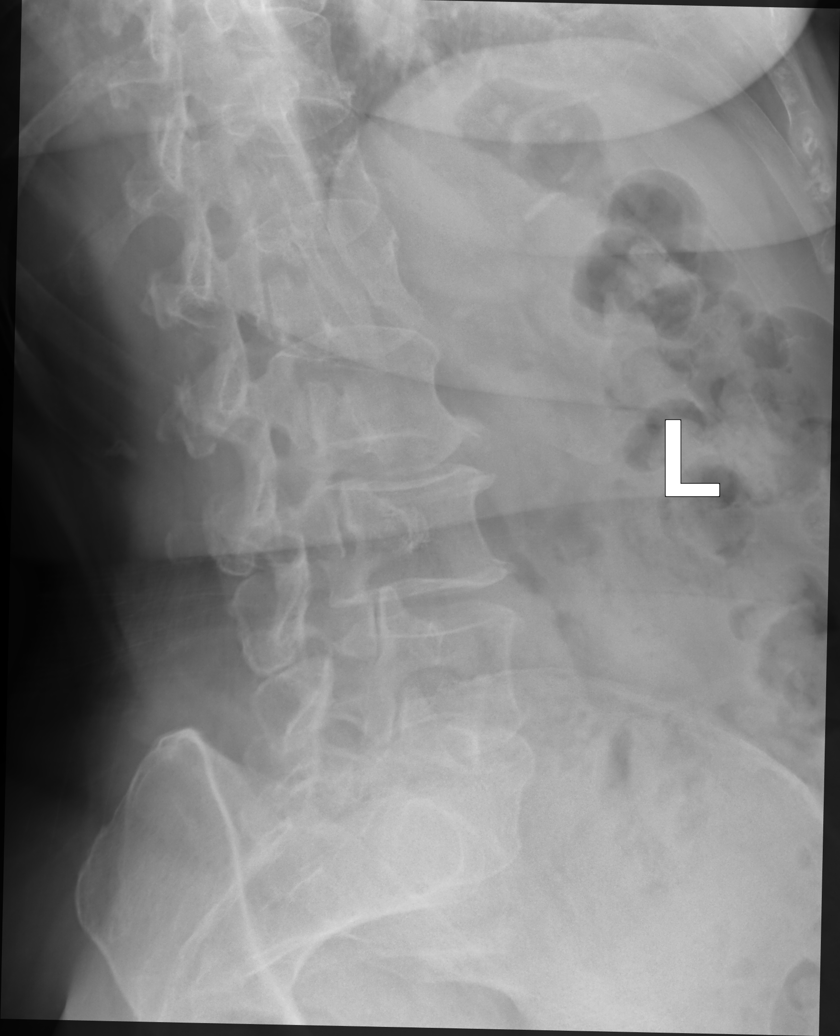

[lumbar spine lat]
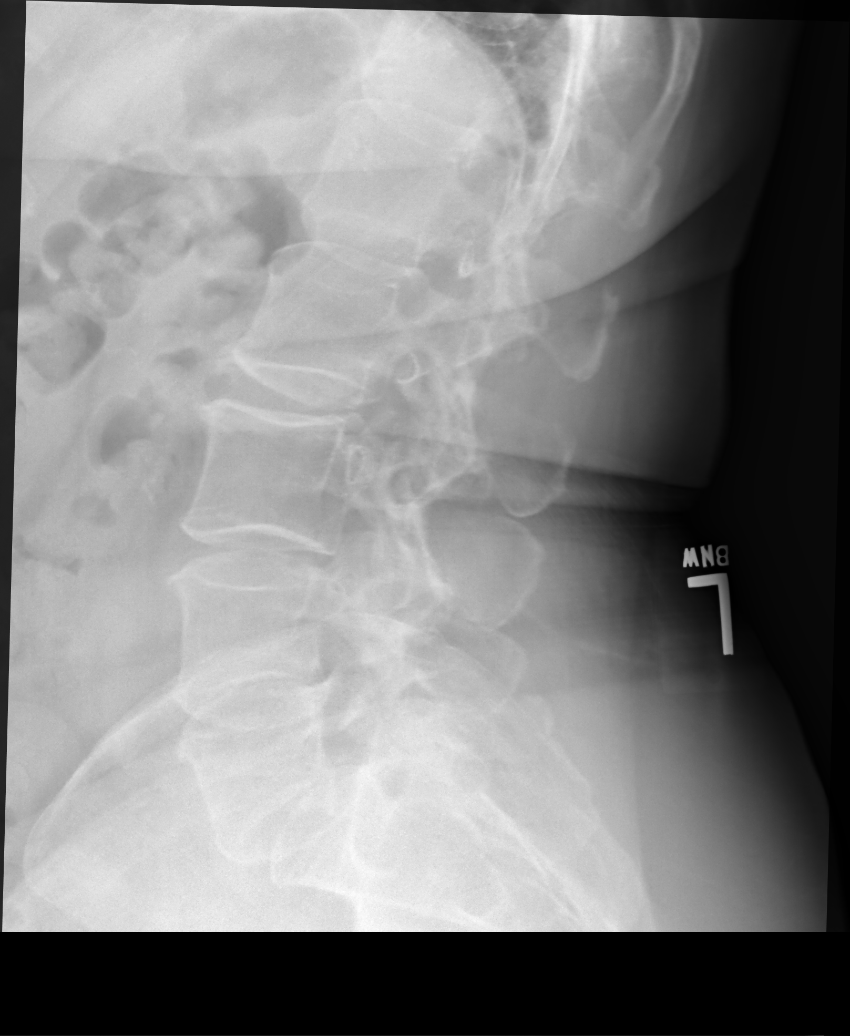

[lumbar spot lat]
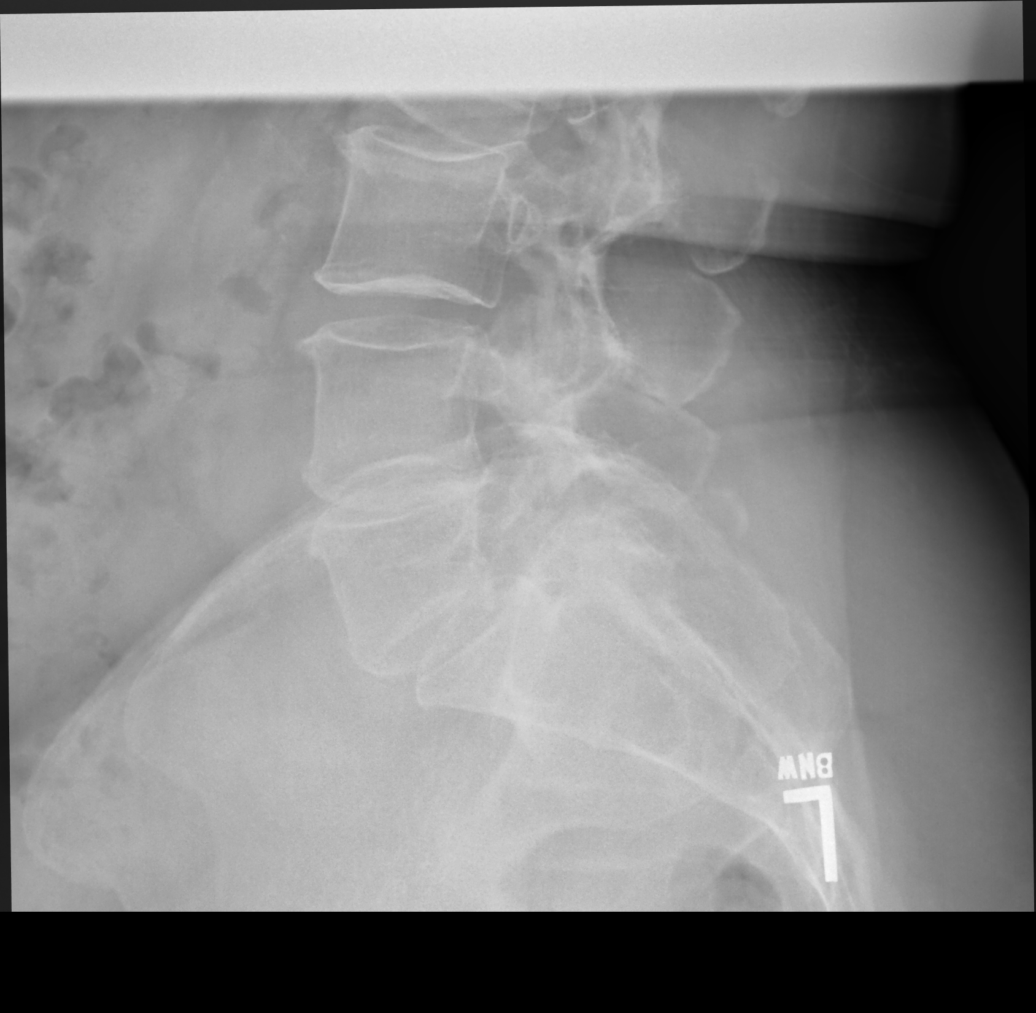

[5 of 5 positions shown; findings below may reference images not displayed]

FINDINGS: Progressive scoliosis lumbar spine convex right.

No acute compression fracture

Question L5 pars defects with mild anterior slip L5 and mild L5-S1
disc space narrowing.

L4-5 mild to moderate disc space narrowing.

L3-4 moderate left-sided disc space narrowing.

L2-3 mild to moderate left-sided disc space narrowing.

L1-2 mild left-sided disc space narrowing.
IMPRESSION: Progressive scoliosis lumbar spine convex right. Progression of
superimposed degenerative changes as detailed above.

## 2018-11-08 ENCOUNTER — Ambulatory Visit: Payer: Medicare Other

## 2018-11-09 ENCOUNTER — Ambulatory Visit: Payer: Medicare Other

## 2018-11-26 ENCOUNTER — Encounter: Payer: Medicare Other | Admitting: Internal Medicine

## 2018-12-02 ENCOUNTER — Other Ambulatory Visit: Payer: Self-pay | Admitting: Internal Medicine

## 2018-12-08 ENCOUNTER — Other Ambulatory Visit: Payer: Self-pay | Admitting: Internal Medicine

## 2019-02-19 ENCOUNTER — Other Ambulatory Visit: Payer: Self-pay

## 2019-02-26 ENCOUNTER — Other Ambulatory Visit: Payer: Self-pay

## 2019-02-26 ENCOUNTER — Ambulatory Visit (INDEPENDENT_AMBULATORY_CARE_PROVIDER_SITE_OTHER): Payer: Medicare Other | Admitting: Internal Medicine

## 2019-02-26 ENCOUNTER — Encounter: Payer: Self-pay | Admitting: Internal Medicine

## 2019-02-26 VITALS — BP 120/86 | HR 65 | Temp 98.2°F | Resp 14 | Ht 64.5 in | Wt 176.0 lb

## 2019-02-26 DIAGNOSIS — E6609 Other obesity due to excess calories: Secondary | ICD-10-CM | POA: Diagnosis not present

## 2019-02-26 DIAGNOSIS — Z6830 Body mass index (BMI) 30.0-30.9, adult: Secondary | ICD-10-CM

## 2019-02-26 DIAGNOSIS — M79671 Pain in right foot: Secondary | ICD-10-CM | POA: Diagnosis not present

## 2019-02-26 DIAGNOSIS — E785 Hyperlipidemia, unspecified: Secondary | ICD-10-CM

## 2019-02-26 DIAGNOSIS — E119 Type 2 diabetes mellitus without complications: Secondary | ICD-10-CM

## 2019-02-26 DIAGNOSIS — Z1231 Encounter for screening mammogram for malignant neoplasm of breast: Secondary | ICD-10-CM

## 2019-02-26 DIAGNOSIS — Z0000 Encounter for general adult medical examination without abnormal findings: Secondary | ICD-10-CM

## 2019-02-26 DIAGNOSIS — Z1239 Encounter for other screening for malignant neoplasm of breast: Secondary | ICD-10-CM

## 2019-02-26 NOTE — Progress Notes (Signed)
Patient ID: Amanda Everett, female    DOB: September 09, 1950  Age: 68 y.o. MRN: [id number]  The patient is here for follow up  and management of other chronic and acute problems.  [facility] feb 2018  [facility]  Ordered oct 2019 but postponed twice by facility,  Is planned for next week at [facility] feb 2018: normal  COLONOSCOPY 2018   5 YR FLLOW UP 2023 [facility]    The risk factors are reflected in the social history.  The roster of all physicians providing medical care to patient - is listed in the Snapshot section of the chart.  Activities of daily living:  The patient is 100% independent in all ADLs: dressing, toileting, feeding as well as independent mobility  Home safety : The patient has smoke detectors in the home. They wear seatbelts.  There are no firearms at home. There is no violence in the home.   There is no risks for hepatitis, STDs or HIV. There is no   history of blood transfusion. They have no travel history to infectious disease endemic areas of the world.  The patient has seen their dentist in the last six month. They have seen their eye doctor in the last year. They deny any  hearing difficulty with regard to whispered voices and some television programs.  They have deferred audiologic testing in the last year.  They do not  have excessive sun exposure. Discussed the need for sun protection: hats, long sleeves and use of sunscreen if there is significant sun exposure.   Diet: the importance of a healthy diet is discussed. They do have a healthy diet.  The benefits of regular aerobic exercise were discussed. She walks TH [place]MI[place] TWICE DALY FOR  20 minutes,  SWIMS in her pool daily and walks her dog.  .   Depression screen: there are no signs or vegative symptoms of depression- irritability, change in appetite, anhedonia, sadness/tearfullness.  Cognitive assessment: the patient manages all their financial and personal affairs and is actively engaged. They could relate  day,date,year and events; recalled 2/3 objects at 3 minutes; performed clock-face test normally.  The following portions of the patient's history were reviewed and updated as appropriate: allergies, current medications, past family history, past medical history,  past surgical history, past social history  and problem list.  Visual acuity was not assessed per patient preference since she has regular follow up with her ophthalmologist. Hearing and body mass index were assessed and reviewed.   During the course of the visit the patient was educated and counseled about appropriate screening and preventive services including : fall prevention , diabetes screening, nutrition counseling, colorectal cancer screening, and recommended immunizations.    CC: The primary encounter diagnosis was Breast cancer screening. Diagnoses of Encounter for screening mammogram for malignant neoplasm of breast, Hyperlipidemia LDL goal <100, Diabetes mellitus type 2, diet-controlled ([place]), Class 1 obesity due to excess calories without serious comorbidity with body mass index (BMI) of 30.0 to 30.9 in adult, Foot pain, right, and Encounter for preventive health examination were also pertinent to this visit.  Type  DM:   se feels generally well, is exercising several times per week and checking blood sugars once daily at variable times.  BS have been under 130 fasting and < 150 post prandially.  Denies any recent hypoglyemic events.  Taking her medications as directed. Following a carbohydrate modified diet 6 days per week. Denies numbness, burning and tingling of extremities. Appetite is good.  Annual eye exam is due n August  Had right foot pain,  negative podiatry evaluation . Bone spurs and degenerative changes noted on plain films.  pain currently resolve.d   Overweight:  trying to lose weight . Watching portion  Sizes.   watching carbs Amanda Everett  History Amanda Everett has a past medical history of Chicken pox, Colon polyps,  Diabetes mellitus without complication ([place]), Heart murmur, Shingles, Thyroid disease, and UTI (urinary tract infection).   She has a past surgical history that includes Tubal ligation; Colonoscopy (2013); and Colonoscopy with propofol (N/A, 04/12/2017).   Her family history includes Alcohol abuse in her father; Arthritis in her maternal grandmother; Cancer (age of onset: 58) in her father; Cancer (age of onset: 31) in her mother; Heart disease in her maternal grandmother; Stroke in her mother.She reports that she quit smoking about 2 years ago. Her smoking use included cigarettes. She has a 17.50 pack-year smoking history. She has never used smokeless tobacco. She reports current alcohol use of about 1.0 standard drinks of alcohol per week. She reports that she does not use drugs.  Outpatient Medications Prior to Visit  Medication Sig Dispense Refill  . aspirin EC 81 MG tablet Take 81 mg by mouth daily.    Marland Kitchen atorvastatin (LIPITOR) 20 MG tablet TAKE 1 TABLET DAILY 90 tablet 3  . Cholecalciferol (VITAMIN D3) 3000 units TABS Take 1,000 Units by mouth daily.     . diphenhydrAMINE (BENADRYL) 25 MG tablet Take 50 mg by mouth as needed for itching.     . losartan (COZAAR) 50 MG tablet TAKE 1 TABLET DAILY. 90 tablet 1  . Probiotic Product (PROBIOTIC DAILY) CAPS Take 1 capsule by mouth daily.    . nicotine (NICODERM CQ - DOSED IN MG/24 HOURS) 21 mg/24hr patch Place 21 mg onto the skin daily.     No facility-administered medications prior to visit.     Review of Systems   Patient denies headache, fevers, malaise, unintentional weight loss, skin rash, eye pain, sinus congestion and sinus pain, sore throat, dysphagia,  hemoptysis , cough, dyspnea, wheezing, chest pain, palpitations, orthopnea, edema, abdominal pain, nausea, melena, diarrhea, constipation, flank pain, dysuria, hematuria, urinary  Frequency, nocturia, numbness, tingling, seizures,  Focal weakness, Loss of consciousness,  Tremor, insomnia,  depression, anxiety, and suicidal ideation.     Objective:  BP 120/86 (BP Location: Left Arm, Patient Position: Sitting, Cuff Size: Normal)   Pulse 65   Temp 98.2 F (36.8 C) (Oral)   Resp 14   Ht 5' 4.5" (1.638 m)   Wt 176 lb (79.8 kg)   SpO2 99%   BMI 29.74 kg/m   Physical Exam   General appearance: alert, cooperative and appears stated age Head: Normocephalic, without obvious abnormality, atraumatic Eyes: conjunctivae/corneas clear. PERRL, EOM's intact. Fundi benign. Ears: normal TM's and external ear canals both ears Nose: Nares normal. Septum midline. Mucosa normal. No drainage or sinus tenderness. Throat: lips, mucosa, and tongue normal; teeth and gums normal Neck: no adenopathy, no carotid bruit, no JVD, supple, symmetrical, trachea midline and thyroid not enlarged, symmetric, no tenderness/mass/nodules Lungs: clear to auscultation bilaterally Breasts: normal appearance, no masses or tenderness Heart: regular rate and rhythm, S1, S2 normal, no murmur, click, rub or gallop Abdomen: soft, non-tender; bowel sounds normal; no masses,  no organomegaly Extremities: extremities normal, atraumatic, no cyanosis or edema Pulses: 2+ and symmetric Skin: Skin color, texture, turgor normal. No rashes or lesions Neurologic: Alert and oriented X 3, normal strength and tone. Normal  symmetric reflexes. Normal coordination and gait.      Assessment & Plan:   Problem List Items Addressed This Visit      Unprioritized   Obesity     I spent 15 minutes addressing  BMI and recommended wt loss of 10% of body weight over the next 6 months using a low glycemic index diet and regular exercise a minimum of 5 days per week.       Hyperlipidemia LDL goal <100     managed with atorvastatin,  She  has no side effects and liver enzymes are normal.   Lab Results  Component Value Date   CHOL 163 02/26/2019   HDL 67.30 02/26/2019   LDLCALC 72 02/26/2019   LDLDIRECT 84.0 11/15/2017   TRIG 116.0  02/26/2019   CHOLHDL 2 02/26/2019   Lab Results  Component Value Date   ALT 25 02/26/2019   AST 23 02/26/2019   ALKPHOS 39 02/26/2019   BILITOT 0.6 02/26/2019            Relevant Orders   TSH (Completed)   Foot pain, right    Etiology is degenerative bone spurring  given normal ESR  Lab Results  Component Value Date   ESRSEDRATE 6 06/01/2018         Encounter for preventive health examination    age appropriate education and counseling updated, referrals for preventative services and immunizations addressed, dietary and smoking counseling addressed, most recent labs reviewed.  I have personally reviewed and have noted:  1) the patient's medical and social history 2) The pt's use of alcohol, tobacco, and illicit drugs 3) The patient's current medications and supplements 4) Functional ability including ADL's, fall risk, home safety risk, hearing and visual impairment 5) Diet and physical activities 6) Evidence for depression or mood disorder 7) The patient's height, weight, and BMI have been recorded in the chart  I have made referrals, and provided counseling and education based on review of the above      Diabetes mellitus type 2, diet-controlled ([place])    Currently well-controlled on diet alone .  hemoglobin A1c is at goal of less than 7.0 . Patient is scheduled for  an annual eye exam and foot exam is normal today. Patient has no microalbuminuria. Patient is tolerating statin therapy for CAD risk reduction and on ACE/ARB for renal protection and hypertension.  Lab Results  Component Value Date   HGBA1C 6.3 02/26/2019   Lab Results  Component Value Date   MICROALBUR 0.7 02/26/2019          Relevant Orders   Comprehensive metabolic panel (Completed)   Lipid panel (Completed)   Hemoglobin A1c (Completed)   Microalbumin / creatinine urine ratio (Completed)    Other Visit Diagnoses    Breast cancer screening    -  Primary   Encounter for screening  mammogram for malignant neoplasm of breast          I have discontinued [facility]'s nicotine. I am also having her maintain her diphenhydrAMINE, Probiotic Daily, aspirin EC, Vitamin D3, atorvastatin, and losartan.  No orders of the defined types were placed in this encounter.   Medications Discontinued During This Encounter  Medication Reason  . nicotine (NICODERM CQ - DOSED IN MG/24 HOURS) 21 mg/24hr patch Patient has not taken in last 30 days    Follow-up: No follow-ups on file.   Crecencio Mc, MD

## 2019-02-26 NOTE — Patient Instructions (Addendum)
Try the SOLA low carb bread ( try [employer],  In the section  Frozen bread )  3 g/slice  Tastes great!  aqualogix is the water resistance training system you can use in the pool for upper and lower body workouts.    Health Maintenance for Postmenopausal Women Menopause is a normal process in which your ability to get pregnant comes to an end. This process happens slowly over many months or years, usually between the ages of 48 and 41. Menopause is complete when you have missed your menstrual periods for 12 months. It is important to talk with your health care provider about some of the most common conditions that affect women after menopause (postmenopausal women). These include heart disease, cancer, and bone loss (osteoporosis). Adopting a healthy lifestyle and getting preventive care can help to promote your health and wellness. The actions you take can also lower your chances of developing some of these common conditions. What should I know about menopause? During menopause, you may get a number of symptoms, such as:  Hot flashes. These can be moderate or severe.  Night sweats.  Decrease in sex drive.  Mood swings.  Headaches.  Tiredness.  Irritability.  Memory problems.  Insomnia. Choosing to treat or not to treat these symptoms is a decision that you make with your health care provider. Do I need hormone replacement therapy?  Hormone replacement therapy is effective in treating symptoms that are caused by menopause, such as hot flashes and night sweats.  Hormone replacement carries certain risks, especially as you become older. If you are thinking about using estrogen or estrogen with progestin, discuss the benefits and risks with your health care provider. What is my risk for heart disease and stroke? The risk of heart disease, heart attack, and stroke increases as you age. One of the causes may be a change in the body's hormones during menopause. This can affect how your  body uses dietary fats, triglycerides, and cholesterol. Heart attack and stroke are medical emergencies. There are many things that you can do to help prevent heart disease and stroke. Watch your blood pressure  High blood pressure causes heart disease and increases the risk of stroke. This is more likely to develop in people who have high blood pressure readings, are of African descent, or are overweight.  Have your blood pressure checked: ? Every 3-5 years if you are 49-26 years of age. ? Every year if you are 72 years old or older. Eat a healthy diet   Eat a diet that includes plenty of vegetables, fruits, low-fat dairy products, and lean protein.  Do not eat a lot of foods that are high in solid fats, added sugars, or sodium. Get regular exercise Get regular exercise. This is one of the most important things you can do for your health. Most adults should:  Try to exercise for at least 150 minutes each week. The exercise should increase your heart rate and make you sweat (moderate-intensity exercise).  Try to do strengthening exercises at least twice each week. Do these in addition to the moderate-intensity exercise.  Spend less time sitting. Even light physical activity can be beneficial. Other tips  Work with your health care provider to achieve or maintain a healthy weight.  Do not use any products that contain nicotine or tobacco, such as cigarettes, e-cigarettes, and chewing tobacco. If you need help quitting, ask your health care provider.  Know your numbers. Ask your health care provider to check your  cholesterol and your blood sugar (glucose). Continue to have your blood tested as directed by your health care provider. Do I need screening for cancer? Depending on your health history and family history, you may need to have cancer screening at different stages of your life. This may include screening for:  Breast cancer.  Cervical cancer.  Lung cancer.  Colorectal  cancer. What is my risk for osteoporosis? After menopause, you may be at increased risk for osteoporosis. Osteoporosis is a condition in which bone destruction happens more quickly than new bone creation. To help prevent osteoporosis or the bone fractures that can happen because of osteoporosis, you may take the following actions:  If you are 47-47 years old, get at least 1,000 mg of calcium and at least 600 mg of vitamin D per day.  If you are older than age 33 but younger than age 14, get at least 1,200 mg of calcium and at least 600 mg of vitamin D per day.  If you are older than age 110, get at least 1,200 mg of calcium and at least 800 mg of vitamin D per day. Smoking and drinking excessive alcohol increase the risk of osteoporosis. Eat foods that are rich in calcium and vitamin D, and do weight-bearing exercises several times each week as directed by your health care provider. How does menopause affect my mental health? Depression may occur at any age, but it is more common as you become older. Common symptoms of depression include:  Low or sad mood.  Changes in sleep patterns.  Changes in appetite or eating patterns.  Feeling an overall lack of motivation or enjoyment of activities that you previously enjoyed.  Frequent crying spells. Talk with your health care provider if you think that you are experiencing depression. General instructions See your health care provider for regular wellness exams and vaccines. This may include:  Scheduling regular health, dental, and eye exams.  Getting and maintaining your vaccines. These include: ? Influenza vaccine. Get this vaccine each year before the flu season begins. ? Pneumonia vaccine. ? Shingles vaccine. ? Tetanus, diphtheria, and pertussis (Tdap) booster vaccine. Your health care provider may also recommend other immunizations. Tell your health care provider if you have ever been abused or do not feel safe at  home. Summary  Menopause is a normal process in which your ability to get pregnant comes to an end.  This condition causes hot flashes, night sweats, decreased interest in sex, mood swings, headaches, or lack of sleep.  Treatment for this condition may include hormone replacement therapy.  Take actions to keep yourself healthy, including exercising regularly, eating a healthy diet, watching your weight, and checking your blood pressure and blood sugar levels.  Get screened for cancer and depression. Make sure that you are up to date with all your vaccines. This information is not intended to replace advice given to you by your health care provider. Make sure you discuss any questions you have with your health care provider. Document Released: 09/30/2005 Document Revised: 08/01/2018 Document Reviewed: 08/01/2018 Elsevier Patient Education  2020 [employer].

## 2019-02-27 ENCOUNTER — Encounter: Payer: Self-pay | Admitting: Internal Medicine

## 2019-02-27 LAB — COMPREHENSIVE METABOLIC PANEL
ALT: 25 U/L (ref 0–35)
AST: 23 U/L (ref 0–37)
Albumin: 4.5 g/dL (ref 3.5–5.2)
Alkaline Phosphatase: 39 U/L (ref 39–117)
BUN: 15 mg/dL (ref 6–23)
CO2: 27 mEq/L (ref 19–32)
Calcium: 9.4 mg/dL (ref 8.4–10.5)
Chloride: 104 mEq/L (ref 96–112)
Creatinine, Ser: 0.78 mg/dL (ref 0.40–1.20)
GFR: 73.51 mL/min (ref >60.00)
Glucose, Bld: 99 mg/dL (ref 70–99)
Potassium: 4.2 mEq/L (ref 3.5–5.1)
Sodium: 141 mEq/L (ref 135–145)
Total Bilirubin: 0.6 mg/dL (ref 0.2–1.2)
Total Protein: 7.2 g/dL (ref 6.0–8.3)

## 2019-02-27 LAB — LIPID PANEL
Cholesterol: 163 mg/dL (ref 0–200)
HDL: 67.3 mg/dL (ref >39.00)
LDL Cholesterol: 72 mg/dL (ref 0–99)
NonHDL: 95.58
Total CHOL/HDL Ratio: 2
Triglycerides: 116 mg/dL (ref 0.0–149.0)
VLDL: 23.2 mg/dL (ref 0.0–40.0)

## 2019-02-27 LAB — MICROALBUMIN / CREATININE URINE RATIO
Creatinine,U: 87.2 mg/dL
Microalb Creat Ratio: 0.8 mg/g (ref 0.0–30.0)
Microalb, Ur: 0.7 mg/dL (ref 0.0–1.9)

## 2019-02-27 LAB — HEMOGLOBIN A1C: Hgb A1c MFr Bld: 6.3 % (ref 4.6–6.5)

## 2019-02-27 LAB — TSH: TSH: 4.26 u[IU]/mL (ref 0.35–4.50)

## 2019-02-27 NOTE — Assessment & Plan Note (Signed)
managed with atorvastatin,  She  has no side effects and liver enzymes are normal.  ? ?Lab Results  ?Component Value Date  ? CHOL 163 02/26/2019  ? HDL 67.30 02/26/2019  ? LDLCALC 72 02/26/2019  ? LDLDIRECT 84.0 11/15/2017  ? TRIG 116.0 02/26/2019  ? CHOLHDL 2 02/26/2019  ? ?Lab Results  ?Component Value Date  ? ALT 25 02/26/2019  ? AST 23 02/26/2019  ? ALKPHOS 39 02/26/2019  ? BILITOT 0.6 02/26/2019  ? ? ? ? ? ?

## 2019-02-27 NOTE — Assessment & Plan Note (Signed)
Etiology is degenerative bone spurring  given normal ESR  Lab Results  Component Value Date   ESRSEDRATE 6 06/01/2018

## 2019-02-27 NOTE — Assessment & Plan Note (Signed)

## 2019-02-27 NOTE — Assessment & Plan Note (Signed)
I spent 15 minutes addressing  BMI and recommended wt loss of 10% of body weight over the next 6 months using a low glycemic index diet and regular exercise a minimum of 5 days per week.

## 2019-02-27 NOTE — Assessment & Plan Note (Signed)
Currently well-controlled on diet alone .  hemoglobin A1c is at goal of less than 7.0 . Patient is scheduled for  an annual eye exam and foot exam is normal today. Patient has no microalbuminuria. Patient is tolerating statin therapy for CAD risk reduction and on ACE/ARB for renal protection and hypertension.  Lab Results  Component Value Date   HGBA1C 6.3 02/26/2019   Lab Results  Component Value Date   MICROALBUR 0.7 02/26/2019

## 2019-03-05 DIAGNOSIS — Z1231 Encounter for screening mammogram for malignant neoplasm of breast: Secondary | ICD-10-CM | POA: Diagnosis not present

## 2019-03-05 LAB — HM MAMMOGRAPHY

## 2019-05-19 ENCOUNTER — Other Ambulatory Visit: Payer: Self-pay | Admitting: Internal Medicine

## 2019-06-14 DIAGNOSIS — Z23 Encounter for immunization: Secondary | ICD-10-CM | POA: Diagnosis not present

## 2019-10-18 ENCOUNTER — Ambulatory Visit: Payer: Medicare Other | Attending: Internal Medicine

## 2019-10-18 DIAGNOSIS — Z23 Encounter for immunization: Secondary | ICD-10-CM | POA: Insufficient documentation

## 2019-10-18 NOTE — Progress Notes (Signed)
   Covid-19 Vaccination Clinic  Name:  Amanda Everett    MRN: [id number] DOB: 1950-10-16  10/18/2019  [name] was observed post Covid-19 immunization for 30 minutes based on pre-vaccination screening without incidence. She was provided with Vaccine Information Sheet and instruction to access the V-Safe system.   [name] was instructed to call 911 with any severe reactions post vaccine: Marland Kitchen Difficulty breathing  . Swelling of your face and throat  . A fast heartbeat  . A bad rash all over your body  . Dizziness and weakness    Immunizations Administered    Name Date Dose VIS Date Route   Pfizer COVID-19 Vaccine 10/18/2019 12:59 PM 0.3 mL 08/02/2019 Intramuscular   Manufacturer: [place]   Lot: [id number]   [place]: [id number]

## 2019-11-12 ENCOUNTER — Ambulatory Visit: Payer: Medicare Other | Attending: Internal Medicine

## 2019-11-12 DIAGNOSIS — Z23 Encounter for immunization: Secondary | ICD-10-CM

## 2019-11-12 NOTE — Progress Notes (Signed)
   Covid-19 Vaccination Clinic  Name:  Amanda Everett    MRN: [id number] DOB: 08-21-1951  11/12/2019  [name] was observed post Covid-19 immunization for 30 minutes based on pre-vaccination screening without incident. She was provided with Vaccine Information Sheet and instruction to access the V-Safe system.   [name] was instructed to call 911 with any severe reactions post vaccine: Marland Kitchen Difficulty breathing  . Swelling of face and throat  . A fast heartbeat  . A bad rash all over body  . Dizziness and weakness   Immunizations Administered    Name Date Dose VIS Date Route   Pfizer COVID-19 Vaccine 11/12/2019  3:45 PM 0.3 mL 08/02/2019 Intramuscular   Manufacturer: [place]   Lot: [id number]   [place]: [id number]

## 2019-11-28 ENCOUNTER — Other Ambulatory Visit: Payer: Self-pay | Admitting: Internal Medicine

## 2020-04-23 ENCOUNTER — Other Ambulatory Visit: Payer: Self-pay | Admitting: Internal Medicine

## 2020-07-10 DIAGNOSIS — Z23 Encounter for immunization: Secondary | ICD-10-CM | POA: Diagnosis not present

## 2020-11-23 ENCOUNTER — Other Ambulatory Visit: Payer: Self-pay | Admitting: Internal Medicine

## 2021-03-17 DIAGNOSIS — Z20822 Contact with and (suspected) exposure to covid-19: Secondary | ICD-10-CM | POA: Diagnosis not present

## 2021-03-22 DIAGNOSIS — Z20822 Contact with and (suspected) exposure to covid-19: Secondary | ICD-10-CM | POA: Diagnosis not present

## 2021-04-19 ENCOUNTER — Telehealth: Payer: Self-pay | Admitting: Internal Medicine

## 2021-04-19 DIAGNOSIS — R5383 Other fatigue: Secondary | ICD-10-CM

## 2021-04-19 DIAGNOSIS — E119 Type 2 diabetes mellitus without complications: Secondary | ICD-10-CM

## 2021-04-19 DIAGNOSIS — I10 Essential (primary) hypertension: Secondary | ICD-10-CM

## 2021-04-19 DIAGNOSIS — E785 Hyperlipidemia, unspecified: Secondary | ICD-10-CM

## 2021-04-22 DIAGNOSIS — Z20822 Contact with and (suspected) exposure to covid-19: Secondary | ICD-10-CM | POA: Diagnosis not present

## 2021-05-22 DIAGNOSIS — Z20822 Contact with and (suspected) exposure to covid-19: Secondary | ICD-10-CM | POA: Diagnosis not present

## 2021-06-03 DIAGNOSIS — Z23 Encounter for immunization: Secondary | ICD-10-CM | POA: Diagnosis not present

## 2021-06-22 DIAGNOSIS — Z20822 Contact with and (suspected) exposure to covid-19: Secondary | ICD-10-CM | POA: Diagnosis not present

## 2021-08-12 DIAGNOSIS — Z23 Encounter for immunization: Secondary | ICD-10-CM | POA: Diagnosis not present

## 2021-11-09 NOTE — Telephone Encounter (Signed)
Labs have been ordered for lab appt.  

## 2021-11-09 NOTE — Addendum Note (Signed)
Addended by: Adair Laundry on: 11/09/2021 04:21 PM ? ? Modules accepted: Orders ? ?

## 2021-11-09 NOTE — Telephone Encounter (Signed)
Pt husband called in requesting for a new [facility] to [facility] medication (losartan (COZAAR) 50 MG tablet) and (atorvastatin (LIPITOR) 20 MG tablet).... Pt [facility] stated [facility]... Pt husband stated that pt is low on medication...  Pt requesting callback...  ?

## 2021-11-09 NOTE — Telephone Encounter (Signed)
Spoke with pt to let her know that she has not been seen since 2020 and not had any lab work done either. Pt has been scheduled for a lab appt and a follow up appt. Pt has enough medications to get her through till her appt.  ?

## 2021-11-12 ENCOUNTER — Other Ambulatory Visit: Payer: Self-pay

## 2021-11-12 ENCOUNTER — Other Ambulatory Visit (INDEPENDENT_AMBULATORY_CARE_PROVIDER_SITE_OTHER): Payer: Medicare Other

## 2021-11-12 DIAGNOSIS — R5383 Other fatigue: Secondary | ICD-10-CM

## 2021-11-12 DIAGNOSIS — I10 Essential (primary) hypertension: Secondary | ICD-10-CM | POA: Diagnosis not present

## 2021-11-12 DIAGNOSIS — E119 Type 2 diabetes mellitus without complications: Secondary | ICD-10-CM | POA: Diagnosis not present

## 2021-11-12 DIAGNOSIS — E785 Hyperlipidemia, unspecified: Secondary | ICD-10-CM

## 2021-11-12 DIAGNOSIS — E039 Hypothyroidism, unspecified: Secondary | ICD-10-CM

## 2021-11-12 LAB — MICROALBUMIN / CREATININE URINE RATIO
Creatinine,U: 34.2 mg/dL
Microalb Creat Ratio: 2 mg/g (ref 0.0–30.0)
Microalb, Ur: <0.7 mg/dL (ref 0.0–1.9)

## 2021-11-12 LAB — CBC WITH DIFFERENTIAL/PLATELET
Basophils Absolute: 0 10*3/uL (ref 0.0–0.1)
Basophils Relative: 0.6 % (ref 0.0–3.0)
Eosinophils Absolute: 0.2 10*3/uL (ref 0.0–0.7)
Eosinophils Relative: 3.1 % (ref 0.0–5.0)
HCT: 39.9 % (ref 36.0–46.0)
Hemoglobin: 13.3 g/dL (ref 12.0–15.0)
Lymphocytes Relative: 33.7 % (ref 12.0–46.0)
Lymphs Abs: 2.7 10*3/uL (ref 0.7–4.0)
MCHC: 33.4 g/dL (ref 30.0–36.0)
MCV: 94.5 fl (ref 78.0–100.0)
Monocytes Absolute: 0.4 10*3/uL (ref 0.1–1.0)
Monocytes Relative: 5.6 % (ref 3.0–12.0)
Neutro Abs: 4.5 10*3/uL (ref 1.4–7.7)
Neutrophils Relative %: 57 % (ref 43.0–77.0)
Platelets: 208 10*3/uL (ref 150.0–400.0)
RBC: 4.22 Mil/uL (ref 3.87–5.11)
RDW: 14.3 % (ref 11.5–15.5)
WBC: 7.9 10*3/uL (ref 4.0–10.5)

## 2021-11-12 LAB — COMPREHENSIVE METABOLIC PANEL
ALT: 18 U/L (ref 0–35)
AST: 19 U/L (ref 0–37)
Albumin: 4.3 g/dL (ref 3.5–5.2)
Alkaline Phosphatase: 37 U/L — ABNORMAL LOW (ref 39–117)
BUN: 22 mg/dL (ref 6–23)
CO2: 31 mEq/L (ref 19–32)
Calcium: 8.9 mg/dL (ref 8.4–10.5)
Chloride: 103 mEq/L (ref 96–112)
Creatinine, Ser: 0.88 mg/dL (ref 0.40–1.20)
GFR: 66.58 mL/min (ref >60.00)
Glucose, Bld: 110 mg/dL — ABNORMAL HIGH (ref 70–99)
Potassium: 3.8 mEq/L (ref 3.5–5.1)
Sodium: 141 mEq/L (ref 135–145)
Total Bilirubin: 0.9 mg/dL (ref 0.2–1.2)
Total Protein: 6.8 g/dL (ref 6.0–8.3)

## 2021-11-12 LAB — LIPID PANEL
Cholesterol: 195 mg/dL (ref 0–200)
HDL: 74.1 mg/dL (ref >39.00)
LDL Cholesterol: 97 mg/dL (ref 0–99)
NonHDL: 120.69
Total CHOL/HDL Ratio: 3
Triglycerides: 116 mg/dL (ref 0.0–149.0)
VLDL: 23.2 mg/dL (ref 0.0–40.0)

## 2021-11-12 LAB — HEMOGLOBIN A1C: Hgb A1c MFr Bld: 6.4 % (ref 4.6–6.5)

## 2021-11-15 ENCOUNTER — Other Ambulatory Visit: Payer: Self-pay

## 2021-11-15 ENCOUNTER — Encounter: Payer: Self-pay | Admitting: Internal Medicine

## 2021-11-15 ENCOUNTER — Ambulatory Visit (INDEPENDENT_AMBULATORY_CARE_PROVIDER_SITE_OTHER): Payer: Medicare Other | Admitting: Internal Medicine

## 2021-11-15 VITALS — BP 154/80 | HR 77 | Temp 98.0°F | Ht 64.0 in | Wt 179.0 lb

## 2021-11-15 DIAGNOSIS — L439 Lichen planus, unspecified: Secondary | ICD-10-CM | POA: Diagnosis not present

## 2021-11-15 DIAGNOSIS — E119 Type 2 diabetes mellitus without complications: Secondary | ICD-10-CM

## 2021-11-15 DIAGNOSIS — R7989 Other specified abnormal findings of blood chemistry: Secondary | ICD-10-CM

## 2021-11-15 DIAGNOSIS — I10 Essential (primary) hypertension: Secondary | ICD-10-CM

## 2021-11-15 DIAGNOSIS — E785 Hyperlipidemia, unspecified: Secondary | ICD-10-CM | POA: Diagnosis not present

## 2021-11-15 DIAGNOSIS — Z1231 Encounter for screening mammogram for malignant neoplasm of breast: Secondary | ICD-10-CM

## 2021-11-15 MED ORDER — TRIAMCINOLONE ACETONIDE 0.1 % EX CREA: 1.0000 "application " | TOPICAL_CREAM | Freq: Two times a day (BID) | CUTANEOUS | 0 refills | Status: DC

## 2021-11-15 NOTE — Assessment & Plan Note (Signed)
Managed with triamcinolone cream.  Refill needed  ?

## 2021-11-15 NOTE — Assessment & Plan Note (Addendum)
Elevated today with no recent BP checks I the past 2 years. Given her current anxiety level, will not adjust dose until  Home readings  are done  ?

## 2021-11-15 NOTE — Patient Instructions (Addendum)
Please remember that medications for hypertension can sometimes adversely affect kidney function and electrolytes.  For that reason,  I want you to have labs done every 6 months along with your A1c ? ? ?Please check BP at few days in a row and send me some  readings  ? ?Your annual mammogram has been ordered for [facility] in [place]    ? ?

## 2021-11-15 NOTE — Progress Notes (Signed)
Patient ID: Amanda Everett, female    DOB: 06-12-1951  Age: 71 y.o. MRN: [id number] ? ?The patient is here for annual follow up  and management of other chronic and acute problems. ? ?This visit occurred during the SARS-CoV-2 public health emergency.  Safety protocols were in place, including screening questions prior to the visit, additional usage of staff PPE, and extensive cleaning of exam room while observing appropriate contact time as indicated for disinfecting solutions.  ? ?  ?The risk factors are reflected in the social history. ? ?The roster of all physicians providing medical care to patient - is listed in the Snapshot section of the chart. ? ?Activities of daily living:  The patient is 100% independent in all ADLs: dressing, toileting, feeding as well as independent mobility ? ?Home safety : The patient has smoke detectors in the home. They wear seatbelts.  There are no firearms at home. There is no violence in the home.  ? ?There is no risks for hepatitis, STDs or HIV. There is no   history of blood transfusion. They have no travel history to infectious disease endemic areas of the world. ? ?The patient has seen their dentist in the last six month. They have seen their eye doctor in the last year. They admit to slight hearing difficulty with regard to whispered voices and some television programs.  They have deferred audiologic testing in the last year.  They do not  have excessive sun exposure. Discussed the need for sun protection: hats, long sleeves and use of sunscreen if there is significant sun exposure.  ? ?Diet: the importance of a healthy diet is discussed. They do have a healthy diet. ? ?The benefits of regular aerobic exercise were discussed. She walks 4 times per week ,  20 minutes.  ? ?Depression screen: there are no signs or vegative symptoms of depression- irritability, change in appetite, anhedonia, sadness/tearfullness. ? ?Cognitive assessment: the patient manages all their financial and  personal affairs and is actively engaged. They could relate day,date,year and events; recalled 2/3 objects at 3 minutes; performed clock-face test normally. ? ?The following portions of the patient's history were reviewed and updated as appropriate: allergies, current medications, past family history, past medical history,  past surgical history, past social history  and problem list. ? ?Visual acuity was not assessed per patient preference since she has regular follow up with her ophthalmologist. Hearing and body mass index were assessed and reviewed.  ? ?During the course of the visit the patient was educated and counseled about appropriate screening and preventive services including : fall prevention , diabetes screening, nutrition counseling, colorectal cancer screening, and recommended immunizations.   ? ?CC: The primary encounter diagnosis was Breast cancer screening by mammogram. Diagnoses of Diabetes mellitus type 2, diet-controlled ([place]), Lichen planus, Essential hypertension, Elevated TSH, and Hyperlipidemia LDL goal <100 were also pertinent to this visit. ? ?Lost to follow up for nearly 3 years.  She cites fear of COVID and worsening family dysfunction :  TJ,  her 53 yr old son has moved in  back home  due to financial issues and  has a psychiatric diagnosis that is untreated. He hs constantly in conflict with her younger son , Mitzi Hansen 81, who is disabled, sees Dr Aundra Dubin now. History of astrocytoma.  Depressed..  Husband recently diagnosed with prostate CA and she is very anxious about his prognosis  ? ?Taking Aleve PM  for shoulder pain  ? ?T2DM:  she has not been  checking  sugars  or blood pressure .  Taking losartan  and atorvastatin  ? ?History ?Giavanni has a past medical history of Chicken pox, Colon polyps, Diabetes mellitus without complication ([place]), Heart murmur, Shingles, Thyroid disease, and UTI (urinary tract infection).  ? ?She has a past surgical history that includes Tubal ligation; Colonoscopy  (2013); and Colonoscopy with propofol (N/A, 04/12/2017).  ? ?Her family history includes Alcohol abuse in her father; Arthritis in her maternal grandmother; Cancer (age of onset: 65) in her father; Cancer (age of onset: 74) in her mother; Heart disease in her maternal grandmother; Stroke in her mother.She reports that she quit smoking about 5 years ago. Her smoking use included cigarettes. She has a 17.50 pack-year smoking history. She has never used smokeless tobacco. She reports current alcohol use of about 1.0 standard drink per week. She reports that she does not use drugs. ? ?Outpatient Medications Prior to Visit  ?Medication Sig Dispense Refill  ? aspirin EC 81 MG tablet Take 81 mg by mouth daily.    ? atorvastatin (LIPITOR) 20 MG tablet TAKE 1 TABLET DAILY 90 tablet 3  ? Cholecalciferol (VITAMIN D3) 3000 units TABS Take 1,000 Units by mouth daily.     ? diphenhydrAMINE (BENADRYL) 25 MG tablet Take 50 mg by mouth as needed for itching.     ? losartan (COZAAR) 50 MG tablet TAKE 1 TABLET DAILY. 90 tablet 3  ? Probiotic Product (PROBIOTIC DAILY) CAPS Take 1 capsule by mouth daily.    ? ?No facility-administered medications prior to visit.  ? ? ?Review of Systems ? ?Patient denies headache, fevers, malaise, unintentional weight loss, skin rash, eye pain, sinus congestion and sinus pain, sore throat, dysphagia,  hemoptysis , cough, dyspnea, wheezing, chest pain, palpitations, orthopnea, edema, abdominal pain, nausea, melena, diarrhea, constipation, flank pain, dysuria, hematuria, urinary  Frequency, nocturia, numbness, tingling, seizures,  Focal weakness, Loss of consciousness,  Tremor, insomnia, depression, anxiety, and suicidal ideation.   ? ?Objective:  ?BP (!) 154/80 (BP Location: Left Arm, Patient Position: Sitting, Cuff Size: Small)   Pulse 77   Temp 98 ?F (36.7 ?C) (Oral)   Ht '5\' 4"'$  (1.626 m)   Wt 179 lb (81.2 kg)   SpO2 98%   BMI 30.73 kg/m?  ? ?Physical Exam ? ?General appearance: alert, cooperative  and appears stated age ?Head: Normocephalic, without obvious abnormality, atraumatic ?Eyes: conjunctivae/corneas clear. PERRL, EOM's intact. Fundi benign. ?Ears: normal TM's and external ear canals both ears ?Nose: Nares normal. Septum midline. Mucosa normal. No drainage or sinus tenderness. ?Throat: lips, mucosa, and tongue normal; teeth and gums normal ?Neck: no adenopathy, no carotid bruit, no JVD, supple, symmetrical, trachea midline and thyroid not enlarged, symmetric, no tenderness/mass/nodules ?Lungs: clear to auscultation bilaterally ?Breasts: normal appearance, no masses or tenderness ?Heart: regular rate and rhythm, S1, S2 normal, no murmur, click, rub or gallop ?Abdomen: soft, non-tender; bowel sounds normal; no masses,  no organomegaly ?Extremities: extremities normal, atraumatic, no cyanosis or edema ?Pulses: 2+ and symmetric ?Skin: Skin color, texture, turgor normal. No rashes or lesions ?Neurologic: Alert and oriented X 3, normal strength and tone. Normal symmetric reflexes. Normal coordination and gait.    ? ? ?Assessment & Plan:  ? ?Problem List Items Addressed This Visit   ? ? Lichen planus  ?  Managed with triamcinolone cream.  Refill needed  ?  ?  ? Relevant Medications  ? triamcinolone cream (KENALOG) 0.1 %  ? Hyperlipidemia LDL goal <100  ?   managed with atorvastatin,  She  has no side effects and liver enzymes are normal.  ? ?Lab Results  ?Component Value Date  ? CHOL 163 02/26/2019  ? HDL 67.30 02/26/2019  ? [place] 72 02/26/2019  ? LDLDIRECT 84.0 11/15/2017  ? TRIG 116.0 02/26/2019  ? CHOLHDL 2 02/26/2019  ? ?Lab Results  ?Component Value Date  ? ALT 25 02/26/2019  ? AST 23 02/26/2019  ? ALKPHOS 39 02/26/2019  ? BILITOT 0.6 02/26/2019  ? ? ? ? ? ?  ?  ? Essential hypertension  ?  Elevated today with no recent BP checks I the past 2 years. Given her current anxiety level, will not adjust dose until  Home readings  are done  ?  ?  ? Elevated TSH  ?  Minimal, with only weight gain as the  attributable symptoms.  Normal on repeat . Repeat is due  ? ?Lab Results  ?Component Value Date  ? TSH 4.26 02/26/2019  ? ? ?  ?  ? Diabetes mellitus type 2, diet-controlled ([place])  ?  She has been lost to follow

## 2021-11-15 NOTE — Assessment & Plan Note (Addendum)
She has been lost to follow up since July 2020, not following a low GI diet, or exercise  ? ?

## 2021-11-15 NOTE — Assessment & Plan Note (Signed)
managed with atorvastatin,  She  has no side effects and liver enzymes are normal.  ? ?Lab Results  ?Component Value Date  ? CHOL 163 02/26/2019  ? HDL 67.30 02/26/2019  ? [place] 72 02/26/2019  ? LDLDIRECT 84.0 11/15/2017  ? TRIG 116.0 02/26/2019  ? CHOLHDL 2 02/26/2019  ? ?Lab Results  ?Component Value Date  ? ALT 25 02/26/2019  ? AST 23 02/26/2019  ? ALKPHOS 39 02/26/2019  ? BILITOT 0.6 02/26/2019  ? ? ? ? ? ?

## 2021-11-15 NOTE — Assessment & Plan Note (Signed)
Minimal, with only weight gain as the attributable symptoms.  Normal on repeat . Repeat is due  ? ?Lab Results  ?Component Value Date  ? TSH 4.26 02/26/2019  ? ? ?

## 2021-11-16 DIAGNOSIS — E039 Hypothyroidism, unspecified: Secondary | ICD-10-CM | POA: Insufficient documentation

## 2021-11-16 LAB — TSH: TSH: 11.39 u[IU]/mL — ABNORMAL HIGH (ref 0.35–5.50)

## 2021-11-16 MED ORDER — LEVOTHYROXINE SODIUM 50 MCG PO TABS: 50.0000 ug | ORAL_TABLET | Freq: Every day | ORAL | 0 refills | Status: DC

## 2021-11-16 NOTE — Assessment & Plan Note (Signed)
Starting levothyroxine at 50 mcg daily  ?

## 2021-11-16 NOTE — Addendum Note (Signed)
Addended by: Crecencio Mc on: 11/16/2021 01:15 PM ? ? Modules accepted: Orders ? ?

## 2021-11-17 ENCOUNTER — Other Ambulatory Visit: Payer: Self-pay

## 2021-11-17 ENCOUNTER — Telehealth: Payer: Self-pay | Admitting: Internal Medicine

## 2021-11-17 MED ORDER — ATORVASTATIN CALCIUM 20 MG PO TABS: 20.0000 mg | ORAL_TABLET | Freq: Every day | ORAL | 3 refills | Status: DC

## 2021-11-17 MED ORDER — LEVOTHYROXINE SODIUM 50 MCG PO TABS: 50.0000 ug | ORAL_TABLET | Freq: Every day | ORAL | 0 refills | Status: DC

## 2021-11-17 NOTE — Telephone Encounter (Signed)
Patients husband called back and forgot to add atorvastatin (LIPITOR) 20 MG tablet fo be filled by her mail order pharmacy. ?

## 2021-11-17 NOTE — Telephone Encounter (Signed)
sent 

## 2021-11-17 NOTE — Telephone Encounter (Signed)
Patient's pharmacy has changed from [facility] to [facility]. Mail order pharmacy. levothyroxine (SYNTHROID) 50 MCG tablet, this was sent to [facility] and needs to go to [facility], please. ?

## 2021-11-18 NOTE — Telephone Encounter (Signed)
Sent!

## 2021-11-19 ENCOUNTER — Telehealth: Payer: Self-pay

## 2021-11-19 NOTE — Telephone Encounter (Signed)
LMTCB to schedule patient for TSH lab 6 weeks out. Lab is ordered.  ?

## 2021-11-28 ENCOUNTER — Encounter: Payer: Self-pay | Admitting: Internal Medicine

## 2021-11-30 ENCOUNTER — Other Ambulatory Visit: Payer: Self-pay | Admitting: Internal Medicine

## 2021-11-30 DIAGNOSIS — I10 Essential (primary) hypertension: Secondary | ICD-10-CM

## 2021-11-30 MED ORDER — LOSARTAN POTASSIUM-HCTZ 50-12.5 MG PO TABS: 1.0000 | ORAL_TABLET | Freq: Every day | ORAL | 1 refills | Status: DC

## 2021-11-30 NOTE — Assessment & Plan Note (Signed)
Home readings range from 734 to 037 systolic.  Adding 12.5 mg hctz to losartan ?

## 2021-12-01 ENCOUNTER — Other Ambulatory Visit: Payer: Self-pay

## 2021-12-01 MED ORDER — LOSARTAN POTASSIUM-HCTZ 50-12.5 MG PO TABS: 1.0000 | ORAL_TABLET | Freq: Every day | ORAL | 1 refills | Status: DC

## 2021-12-28 ENCOUNTER — Other Ambulatory Visit (INDEPENDENT_AMBULATORY_CARE_PROVIDER_SITE_OTHER): Payer: Medicare Other

## 2021-12-28 DIAGNOSIS — E039 Hypothyroidism, unspecified: Secondary | ICD-10-CM | POA: Diagnosis not present

## 2021-12-28 LAB — TSH: TSH: 4.56 u[IU]/mL (ref 0.35–5.50)

## 2021-12-30 ENCOUNTER — Other Ambulatory Visit: Payer: Self-pay | Admitting: Internal Medicine

## 2022-02-16 ENCOUNTER — Telehealth: Payer: Self-pay | Admitting: Internal Medicine

## 2022-02-16 NOTE — Telephone Encounter (Signed)
Spoke with patient to sched her AWV she stated to call back in 03/2022 due to spouse having chemo

## 2022-03-08 ENCOUNTER — Telehealth: Payer: Self-pay

## 2022-03-08 NOTE — Telephone Encounter (Signed)
Patient is due for their 5 year follow up colonoscopy. Last one done 04/12/2017 by Dr Bary Castilla. Left message to see if they would like a referral to [facility] to have this done, or if they would like to return to Dr Bary Castilla who is at [facility].

## 2022-03-28 ENCOUNTER — Telehealth: Payer: Self-pay | Admitting: Internal Medicine

## 2022-03-28 NOTE — Telephone Encounter (Signed)
Copied from [place] [id number]. Topic: Medicare AWV >> Mar 28, 2022 10:25 AM Devoria Glassing wrote: Reason for CRM: Left message for patient to schedule Annual Wellness Visit.  Please schedule with Nurse Health Advisor Denisa O'Brien-Blaney, LPN at [facility]. This appt can be telephone or office visit.  Please call [phone] ask for [facility]

## 2022-04-17 ENCOUNTER — Other Ambulatory Visit: Payer: Self-pay | Admitting: Internal Medicine

## 2022-05-04 ENCOUNTER — Telehealth: Payer: Self-pay | Admitting: Internal Medicine

## 2022-05-04 NOTE — Telephone Encounter (Signed)
Copied from [place] [id number]. Topic: Medicare AWV >> May 04, 2022  2:07 PM Devoria Glassing wrote: Reason for CRM: Left message for patient to schedule Annual Wellness Visit.  Please schedule with Nurse Health Advisor Denisa O'Brien-Blaney, LPN at [facility]. This appt can be telephone or office visit.  Please call [phone] ask for [facility]

## 2022-05-18 ENCOUNTER — Ambulatory Visit (INDEPENDENT_AMBULATORY_CARE_PROVIDER_SITE_OTHER): Payer: Medicare Other | Admitting: Internal Medicine

## 2022-05-18 ENCOUNTER — Encounter: Payer: Self-pay | Admitting: Internal Medicine

## 2022-05-18 VITALS — BP 120/74 | HR 67 | Temp 98.1°F | Ht 64.0 in | Wt 182.1 lb

## 2022-05-18 DIAGNOSIS — Z1211 Encounter for screening for malignant neoplasm of colon: Secondary | ICD-10-CM | POA: Diagnosis not present

## 2022-05-18 DIAGNOSIS — E785 Hyperlipidemia, unspecified: Secondary | ICD-10-CM

## 2022-05-18 DIAGNOSIS — I10 Essential (primary) hypertension: Secondary | ICD-10-CM

## 2022-05-18 DIAGNOSIS — F329 Major depressive disorder, single episode, unspecified: Secondary | ICD-10-CM

## 2022-05-18 DIAGNOSIS — E119 Type 2 diabetes mellitus without complications: Secondary | ICD-10-CM | POA: Diagnosis not present

## 2022-05-18 DIAGNOSIS — E039 Hypothyroidism, unspecified: Secondary | ICD-10-CM

## 2022-05-18 LAB — LIPID PANEL
Cholesterol: 196 mg/dL (ref 0–200)
HDL: 74.8 mg/dL (ref >39.00)
LDL Cholesterol: 101 mg/dL — ABNORMAL HIGH (ref 0–99)
NonHDL: 120.81
Total CHOL/HDL Ratio: 3
Triglycerides: 100 mg/dL (ref 0.0–149.0)
VLDL: 20 mg/dL (ref 0.0–40.0)

## 2022-05-18 LAB — LDL CHOLESTEROL, DIRECT: Direct LDL: 98 mg/dL

## 2022-05-18 LAB — COMPREHENSIVE METABOLIC PANEL
ALT: 65 U/L — ABNORMAL HIGH (ref 0–35)
AST: 50 U/L — ABNORMAL HIGH (ref 0–37)
Albumin: 4.3 g/dL (ref 3.5–5.2)
Alkaline Phosphatase: 54 U/L (ref 39–117)
BUN: 20 mg/dL (ref 6–23)
CO2: 33 mEq/L — ABNORMAL HIGH (ref 19–32)
Calcium: 9.5 mg/dL (ref 8.4–10.5)
Chloride: 100 mEq/L (ref 96–112)
Creatinine, Ser: 1.01 mg/dL (ref 0.40–1.20)
GFR: 56.23 mL/min — ABNORMAL LOW (ref >60.00)
Glucose, Bld: 130 mg/dL — ABNORMAL HIGH (ref 70–99)
Potassium: 4.2 mEq/L (ref 3.5–5.1)
Sodium: 139 mEq/L (ref 135–145)
Total Bilirubin: 0.5 mg/dL (ref 0.2–1.2)
Total Protein: 7.3 g/dL (ref 6.0–8.3)

## 2022-05-18 LAB — HEMOGLOBIN A1C: Hgb A1c MFr Bld: 6.7 % — ABNORMAL HIGH (ref 4.6–6.5)

## 2022-05-18 MED ORDER — RSVPREF3 VAC RECOMB ADJUVANTED 120 MCG/0.5ML IM SUSR: 0.5000 mL | Freq: Once | INTRAMUSCULAR | 0 refills | Status: AC

## 2022-05-18 MED ORDER — BUPROPION HCL ER (XL) 150 MG PO TB24: 150.0000 mg | ORAL_TABLET | Freq: Every day | ORAL | 0 refills | Status: DC

## 2022-05-18 NOTE — Assessment & Plan Note (Addendum)
A total of 40 minutes was spent with patient in counseling patient on her self imposed feelings of guilt and failure resulting from her the her current situation and symptoms. She is not suicidal .  .Advised to start wellbutrin. rtc one month

## 2022-05-18 NOTE — Assessment & Plan Note (Addendum)
Thyroid function is WNL on current dose.  No current changes needed.   Lab Results  Component Value Date   TSH 4.56 12/28/2021

## 2022-05-18 NOTE — Patient Instructions (Signed)
Please start wellbutrin for your depression.  Take the tablet once daily in the morning   Return in one month   Amanda Everett (and you) should get the RSV vaccines.  Your pharmacy can give them to you.
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
## 2022-05-18 NOTE — Assessment & Plan Note (Signed)
Well controlled with diet despite being  lost to follow up since July 2020  Lab Results  Component Value Date   HGBA1C 6.4 11/12/2021

## 2022-05-18 NOTE — Assessment & Plan Note (Signed)
Well controlled on current regimen of losartan/hct . Renal function stable, no changes today.

## 2022-06-20 ENCOUNTER — Encounter: Payer: Self-pay | Admitting: Internal Medicine

## 2022-06-20 ENCOUNTER — Ambulatory Visit (INDEPENDENT_AMBULATORY_CARE_PROVIDER_SITE_OTHER): Payer: Medicare Other | Admitting: Internal Medicine

## 2022-06-20 VITALS — BP 124/80 | HR 61 | Temp 97.6°F | Ht 64.0 in | Wt 178.2 lb

## 2022-06-20 DIAGNOSIS — R748 Abnormal levels of other serum enzymes: Secondary | ICD-10-CM | POA: Diagnosis not present

## 2022-06-20 DIAGNOSIS — E119 Type 2 diabetes mellitus without complications: Secondary | ICD-10-CM | POA: Diagnosis not present

## 2022-06-20 DIAGNOSIS — F329 Major depressive disorder, single episode, unspecified: Secondary | ICD-10-CM | POA: Diagnosis not present

## 2022-06-20 DIAGNOSIS — H43811 Vitreous degeneration, right eye: Secondary | ICD-10-CM | POA: Diagnosis not present

## 2022-06-20 LAB — HEPATIC FUNCTION PANEL
ALT: 18 U/L (ref 0–35)
AST: 20 U/L (ref 0–37)
Albumin: 4.5 g/dL (ref 3.5–5.2)
Alkaline Phosphatase: 38 U/L — ABNORMAL LOW (ref 39–117)
Bilirubin, Direct: 0.1 mg/dL (ref 0.0–0.3)
Total Bilirubin: 0.7 mg/dL (ref 0.2–1.2)
Total Protein: 7.2 g/dL (ref 6.0–8.3)

## 2022-06-20 MED ORDER — SERTRALINE HCL 50 MG PO TABS: 50.0000 mg | ORAL_TABLET | Freq: Every day | ORAL | 0 refills | Status: DC

## 2022-06-20 NOTE — Assessment & Plan Note (Signed)
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  Changing medication to sertraline

## 2022-06-20 NOTE — Assessment & Plan Note (Signed)
Etiology unclear , but she is taking a statin . Repeat LFts are nomal.  r  Lab Results  Component Value Date   ALT 18 06/20/2022   AST 20 06/20/2022   ALKPHOS 38 (L) 06/20/2022   BILITOT 0.7 06/20/2022
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
## 2022-06-20 NOTE — Patient Instructions (Addendum)
FOR THE DEPRESSION:  STOP THE WELLBUTRIN.  Please start the  Sertraline (generic for Zoloft) at 1/2 tablet daily  with  a meal for the first week to avoid nausea.  You can increase to a full tablet after 1 week if you havenot developed side effects of nausea.    You should start to feel a difference after two weeks on the full dose    FOR YOUR LIVER ENZYME ELEVATION   Repeat liver enzymes today,  if still abnormal   we will suspend atorvastatin   and check additional tests   FOR YOUR DIABETES:  Diet and exercise for now.  Repeat a1c in 3 months   Make sure your eye doctor knows that you need a "DIABETIC EYE EXAM"

## 2022-06-20 NOTE — Assessment & Plan Note (Addendum)
Well controlled with diet despite being  lost to follow up since July 2020.  She is taking an ARB and a  statin but has developed mild liver enzyme elevation   Lab Results  Component Value Date   HGBA1C 6.7 (H) 05/18/2022

## 2022-06-29 ENCOUNTER — Ambulatory Visit (INDEPENDENT_AMBULATORY_CARE_PROVIDER_SITE_OTHER): Payer: Medicare Other

## 2022-06-29 VITALS — Ht 64.0 in | Wt 178.0 lb

## 2022-06-29 DIAGNOSIS — Z0000 Encounter for general adult medical examination without abnormal findings: Secondary | ICD-10-CM

## 2022-06-29 NOTE — Patient Instructions (Addendum)
Amanda Everett , Thank you for taking time to come for your Medicare Wellness Visit. I appreciate your ongoing commitment to your health goals. Please review the following plan we discussed and let me know if I can assist you in the future.   These are the goals we discussed:  Goals       Patient Stated     Eat a healthy diet and walk more for exercise (pt-stated)        This is a list of the screening recommended for you and due dates:  Health Maintenance  Topic Date Due   COVID-19 Vaccine (3 - Pfizer series) 07/06/2022*   Eye exam for diabetics  07/13/2022*   Flu Shot  11/20/2022*   Mammogram  07/27/2023*   Colon Cancer Screening  08/25/2023*   Yearly kidney health urinalysis for diabetes  11/13/2022   Hemoglobin A1C  11/16/2022   Yearly kidney function blood test for diabetes  05/19/2023   Complete foot exam   06/21/2023   Medicare Annual Wellness Visit  06/30/2023   Tetanus Vaccine  04/23/2024   Pneumonia Vaccine  Completed   DEXA scan (bone density measurement)  Completed   Hepatitis C Screening: USPSTF Recommendation to screen - Ages 46-79 yo.  Completed   Zoster (Shingles) Vaccine  Completed   HPV Vaccine  Aged Out  *Topic was postponed. The date shown is not the original due date.    Advanced directives: End of life planning; Advanced aging; Advanced directives discussed.  No HCPOA/Living Will.  Additional information in the office. Declined at this time.  Conditions/risks identified: none new  Next appointment: Follow up in one year for your annual wellness visit    Preventive Care 65 Years and Older, Female Preventive care refers to lifestyle choices and visits with your health care provider that can promote health and wellness. What does preventive care include? A yearly physical exam. This is also called an annual well check. Dental exams once or twice a year. Routine eye exams. Ask your health care provider how often you should have your eyes  checked. Personal lifestyle choices, including: Daily care of your teeth and gums. Regular physical activity. Eating a healthy diet. Avoiding tobacco and drug use. Limiting alcohol use. Practicing safe sex. Taking low-dose aspirin every day. Taking vitamin and mineral supplements as recommended by your health care provider. What happens during an annual well check? The services and screenings done by your health care provider during your annual well check will depend on your age, overall health, lifestyle risk factors, and family history of disease. Counseling  Your health care provider may ask you questions about your: Alcohol use. Tobacco use. Drug use. Emotional well-being. Home and relationship well-being. Sexual activity. Eating habits. History of falls. Memory and ability to understand (cognition). Work and work Statistician. Reproductive health. Screening  You may have the following tests or measurements: Height, weight, and BMI. Blood pressure. Lipid and cholesterol levels. These may be checked every 5 years, or more frequently if you are over 13 years old. Skin check. Lung cancer screening. You may have this screening every year starting at age 24 if you have a 30-pack-year history of smoking and currently smoke or have quit within the past 15 years. Fecal occult blood test (FOBT) of the stool. You may have this test every year starting at age 55. Flexible sigmoidoscopy or colonoscopy. You may have a sigmoidoscopy every 5 years or a colonoscopy every 10 years starting at age 103. Hepatitis C  blood test. Hepatitis B blood test. Sexually transmitted disease (STD) testing. Diabetes screening. This is done by checking your blood sugar (glucose) after you have not eaten for a while (fasting). You may have this done every 1-3 years. Bone density scan. This is done to screen for osteoporosis. You may have this done starting at age 65. Mammogram. This may be done every 1-2  years. Talk to your health care provider about how often you should have regular mammograms. Talk with your health care provider about your test results, treatment options, and if necessary, the need for more tests. Vaccines  Your health care provider may recommend certain vaccines, such as: Influenza vaccine. This is recommended every year. Tetanus, diphtheria, and acellular pertussis (Tdap, Td) vaccine. You may need a Td booster every 10 years. Zoster vaccine. You may need this after age 96. Pneumococcal 13-valent conjugate (PCV13) vaccine. One dose is recommended after age 53. Pneumococcal polysaccharide (PPSV23) vaccine. One dose is recommended after age 56. Talk to your health care provider about which screenings and vaccines you need and how often you need them. This information is not intended to replace advice given to you by your health care provider. Make sure you discuss any questions you have with your health care provider. Document Released: 09/04/2015 Document Revised: 04/27/2016 Document Reviewed: 06/09/2015 Elsevier Interactive Patient Education  2017 [place] Prevention in the Home Falls can cause injuries. They can happen to people of all ages. There are many things you can do to make your home safe and to help prevent falls. What can I do on the outside of my home? Regularly fix the edges of walkways and driveways and fix any cracks. Remove anything that might make you trip as you walk through a door, such as a raised step or threshold. Trim any bushes or trees on the path to your home. Use bright outdoor lighting. Clear any walking paths of anything that might make someone trip, such as rocks or tools. Regularly check to see if handrails are loose or broken. Make sure that both sides of any steps have handrails. Any raised decks and porches should have guardrails on the edges. Have any leaves, snow, or ice cleared regularly. Use sand or salt on walking paths  during winter. Clean up any spills in your garage right away. This includes oil or grease spills. What can I do in the bathroom? Use night lights. Install grab bars by the toilet and in the tub and shower. Do not use towel bars as grab bars. Use non-skid mats or decals in the tub or shower. If you need to sit down in the shower, use a plastic, non-slip stool. Keep the floor dry. Clean up any water that spills on the floor as soon as it happens. Remove soap buildup in the tub or shower regularly. Attach bath mats securely with double-sided non-slip rug tape. Do not have throw rugs and other things on the floor that can make you trip. What can I do in the bedroom? Use night lights. Make sure that you have a light by your bed that is easy to reach. Do not use any sheets or blankets that are too big for your bed. They should not hang down onto the floor. Have a firm chair that has side arms. You can use this for support while you get dressed. Do not have throw rugs and other things on the floor that can make you trip. What can I do in the kitchen?  Clean up any spills right away. Avoid walking on wet floors. Keep items that you use a lot in easy-to-reach places. If you need to reach something above you, use a strong step stool that has a grab bar. Keep electrical cords out of the way. Do not use floor polish or wax that makes floors slippery. If you must use wax, use non-skid floor wax. Do not have throw rugs and other things on the floor that can make you trip. What can I do with my stairs? Do not leave any items on the stairs. Make sure that there are handrails on both sides of the stairs and use them. Fix handrails that are broken or loose. Make sure that handrails are as long as the stairways. Check any carpeting to make sure that it is firmly attached to the stairs. Fix any carpet that is loose or worn. Avoid having throw rugs at the top or bottom of the stairs. If you do have throw  rugs, attach them to the floor with carpet tape. Make sure that you have a light switch at the top of the stairs and the bottom of the stairs. If you do not have them, ask someone to add them for you. What else can I do to help prevent falls? Wear shoes that: Do not have high heels. Have rubber bottoms. Are comfortable and fit you well. Are closed at the toe. Do not wear sandals. If you use a stepladder: Make sure that it is fully opened. Do not climb a closed stepladder. Make sure that both sides of the stepladder are locked into place. Ask someone to hold it for you, if possible. Clearly mark and make sure that you can see: Any grab bars or handrails. First and last steps. Where the edge of each step is. Use tools that help you move around (mobility aids) if they are needed. These include: Canes. Walkers. Scooters. Crutches. Turn on the lights when you go into a dark area. Replace any light bulbs as soon as they burn out. Set up your furniture so you have a clear path. Avoid moving your furniture around. If any of your floors are uneven, fix them. If there are any pets around you, be aware of where they are. Review your medicines with your doctor. Some medicines can make you feel dizzy. This can increase your chance of falling. Ask your doctor what other things that you can do to help prevent falls. This information is not intended to replace advice given to you by your health care provider. Make sure you discuss any questions you have with your health care provider. Document Released: 06/04/2009 Document Revised: 01/14/2016 Document Reviewed: 09/12/2014 Elsevier Interactive Patient Education  2017 [employer].

## 2022-06-29 NOTE — Progress Notes (Addendum)
Subjective:   Amanda Everett is a 71 y.o. female who presents for Medicare Annual (Subsequent) preventive examination.  Review of Systems    No ROS.  Medicare Wellness Virtual Visit.  Visual/audio telehealth visit, UTA vital signs.   See social history for additional risk factors.   Cardiac Risk Factors include: advanced age (>65mn, >>3women)     Objective:    Today's Vitals   06/29/22 1315  Weight: 178 lb (80.7 kg)  Height: '5\' 4"'$  (1.626 m)   Body mass index is 30.55 kg/m.     06/29/2022    1:18 PM 11/07/2017    8:39 AM 04/12/2017    9:27 AM  Advanced Directives  Does Patient Have a Medical Advance Directive? No No No  Would patient like information on creating a medical advance directive? No - Patient declined No - Patient declined     Current Medications (verified) Outpatient Encounter Medications as of 06/29/2022  Medication Sig   aspirin EC 81 MG tablet Take 81 mg by mouth daily.   atorvastatin (LIPITOR) 20 MG tablet Take 1 tablet (20 mg total) by mouth daily.   Cholecalciferol (VITAMIN D3) 3000 units TABS Take 1,000 Units by mouth daily.    diphenhydrAMINE (BENADRYL) 25 MG tablet Take 50 mg by mouth as needed for itching.    levothyroxine (SYNTHROID) 50 MCG tablet TAKE 1 TABLET BY MOUTH DAILY   losartan-hydrochlorothiazide (HYZAAR) 50-12.5 MG tablet TAKE 1 TABLET BY MOUTH DAILY   Probiotic Product (PROBIOTIC DAILY) CAPS Take 1 capsule by mouth daily.   sertraline (ZOLOFT) 50 MG tablet Take 1 tablet (50 mg total) by mouth daily.   triamcinolone cream (KENALOG) 0.1 % Apply 1 application. topically 2 (two) times daily. As needed for lichen planus   No facility-administered encounter medications on file as of 06/29/2022.    Allergies (verified) Ivp dye [iodinated contrast media] and Tetracyclines & related   History: Past Medical History:  Diagnosis Date   Chicken pox    Colon polyps    Diabetes mellitus without complication (HCC)    Heart murmur    MVP  slight   Shingles    35   Thyroid disease    UTI (urinary tract infection)    HX   Past Surgical History:  Procedure Laterality Date   COLONOSCOPY  2013   COLONOSCOPY WITH PROPOFOL N/A 04/12/2017   Procedure: COLONOSCOPY WITH PROPOFOL;  Surgeon: BRobert Bellow MD;  Location: [facility] ENDOSCOPY;  Service: Endoscopy;  Laterality: N/A;   TUBAL LIGATION     Family History  Problem Relation Age of Onset   Stroke Mother        During surgery   Cancer Mother 650      hodgkins   Alcohol abuse Father    Cancer Father 334      colon cancer age 5437and 85prostate cancer   Arthritis Maternal Grandmother    Heart disease Maternal Grandmother    Social History   Socioeconomic History   Marital status: Married    Spouse name: Not on file   Number of children: Not on file   Years of education: Not on file   Highest education level: Not on file  Occupational History   Occupation: retired [employer] scientist Tobacco Use   Smoking status: Former    Packs/day: 0.50    Years: 35.00    Total pack years: 17.50    Types: Cigarettes    Quit date: 05/03/2016    Years  since quitting: 6.1   Smokeless tobacco: Never  Substance and Sexual Activity   Alcohol use: Yes    Alcohol/week: 1.0 standard drink of alcohol    Types: 1 Glasses of wine per week    Comment: rarely   Drug use: No   Sexual activity: Never  Other Topics Concern   Not on file  Social History Narrative   Not on file   Social Determinants of Health   Financial Resource Strain: Low Risk  (06/29/2022)   Overall Financial Resource Strain (CARDIA)    Difficulty of Paying Living Expenses: Not hard at all  Food Insecurity: No Food Insecurity (06/29/2022)   Hunger Vital Sign    Worried About Running Out of Food in the Last Year: Never true    Ran Out of Food in the Last Year: Never true  Transportation Needs: No Transportation Needs (06/29/2022)   PRAPARE - Hydrologist (Medical): No    Lack of Transportation  (Non-Medical): No  Physical Activity: Not on file  Stress: No Stress Concern Present (06/29/2022)   [employer] [place]    Feeling of Stress : Not at all  Social Connections: Unknown (06/29/2022)   Social Connection and Isolation Panel [NHANES]    Frequency of Communication with Friends and Family: Not on file    Frequency of Social Gatherings with Friends and Family: Not on file    Attends Religious Services: Not on file    Active Member of Clubs or Organizations: Not on file    Attends Archivist Meetings: Not on file    Marital Status: Married    Tobacco Counseling Counseling given: Not Answered   Clinical Intake:  Pre-visit preparation completed: Yes        Diabetes: No  How often do you need to have someone help you when you read instructions, pamphlets, or other written materials from your doctor or pharmacy?: 1 - Never   Interpreter Needed?: No      Activities of Daily Living    06/29/2022    1:18 PM  In your present state of health, do you have any difficulty performing the following activities:  Hearing? 0  Vision? 0  Difficulty concentrating or making decisions? 0  Walking or climbing stairs? 0  Dressing or bathing? 0  Doing errands, shopping? 0  Preparing Food and eating ? N  Using the Toilet? N  In the past six months, have you accidently leaked urine? N  Do you have problems with loss of bowel control? N  Managing your Medications? N  Managing your Finances? N  Housekeeping or managing your Housekeeping? N    Patient Care Team: Crecencio Mc, MD as PCP - General (Internal Medicine)  Indicate any recent Medical Services you may have received from other than [facility] providers in the past year (date may be approximate).     Assessment:   This is a routine wellness examination for Amanda Everett.  I connected with  Amanda Everett on 06/29/22 by a audio enabled telemedicine application and  verified that I am speaking with the correct person using two identifiers.  Patient Location: Home  Provider Location: Office/Clinic  I discussed the limitations of evaluation and management by telemedicine. The patient expressed understanding and agreed to proceed.   Hearing/Vision screen Hearing Screening - Comments:: Patient is able to hear conversational tones without difficulty.  No issues reported.   Vision Screening - Comments:: Followed by Dr.  Marvel Plan Wears corrective lenses They have regular follow up with the ophthalmologist    Dietary issues and exercise activities discussed: Current Exercise Habits: Home exercise routine, Type of exercise: walking, Intensity: Mild  Low carb/low sugar diet Good water intake   Goals Addressed               This Visit's Progress     Patient Stated     Eat a healthy diet and walk more for exercise (pt-stated)         Depression Screen    06/29/2022    1:10 PM 06/20/2022    9:41 AM 05/18/2022   10:42 AM 05/18/2022   10:40 AM 02/26/2019    1:38 PM 11/07/2017    8:46 AM  PHQ 2/9 Scores  PHQ - 2 Score '2 2 6 '$ 0 0 0  PHQ- 9 Score '2 2 20  '$ 0     Fall Risk    06/29/2022    1:15 PM 06/20/2022    9:07 AM 05/18/2022   10:39 AM 02/26/2019    1:38 PM 11/07/2017    8:46 AM  [place] in the past year? 0 0 0 0 No  Number falls in past yr: 0      Injury with Fall? 0      Risk for fall due to :  No Fall Risks No Fall Risks    Follow up Education provided;Follow up appointment Falls evaluation completed Falls evaluation completed      [place]: Home free of loose throw rugs in walkways, pet beds, electrical cords, etc? Yes  Adequate lighting in your home to reduce risk of falls? Yes   ASSISTIVE DEVICES UTILIZED TO PREVENT FALLS: Life alert? No  Use of a cane, walker or w/c? No   TIMED UP AND GO: Was the test performed? No .   Cognitive Function: 6CIT refused.  Manages her own  medications and finances without issues.  100% independent.      11/07/2017    5:11 PM  MMSE - Mini Mental State Exam  Orientation to time 5  Orientation to Place 5  Registration 3  Attention/ Calculation 5  Recall 3  Language- name 2 objects 2  Language- repeat 1  Language- follow 3 step command 3  Language- read & follow direction 1  Write a sentence 1  Copy design 1  Total score 30        Immunizations Immunization History  Administered Date(s) Administered   Influenza, High Dose Seasonal PF 06/14/2019   Influenza,inj,Quad PF,6+ Mos 07/03/2014, 09/02/2015   Influenza-Unspecified 07/26/2016, 05/29/2017, 08/30/2018   PFIZER(Purple Top)SARS-COV-2 Vaccination 10/18/2019, 11/12/2019   Pneumococcal Conjugate-13 11/07/2017   Pneumococcal Polysaccharide-23 09/02/2015, 09/16/2016   Tdap 04/23/2014   Zoster Recombinat (Shingrix) 05/04/2018, 07/25/2018   Screening Tests Health Maintenance  Topic Date Due   COVID-19 Vaccine (3 - Pfizer series) 07/06/2022 (Originally 01/07/2020)   OPHTHALMOLOGY EXAM  07/13/2022 (Originally 06/07/1961)   INFLUENZA VACCINE  11/20/2022 (Originally 03/22/2022)   MAMMOGRAM  07/27/2023 (Originally 03/04/2020)   COLONOSCOPY (Pts 45-33yr Insurance coverage will need to be confirmed)  08/25/2023 (Originally 04/12/2022)   Diabetic kidney evaluation - Urine ACR  11/13/2022   HEMOGLOBIN A1C  11/16/2022   Diabetic kidney evaluation - GFR measurement  05/19/2023   FOOT EXAM  06/21/2023   Medicare Annual Wellness (AWV)  06/30/2023   TETANUS/TDAP  04/23/2024   Pneumonia Vaccine 71 Years old  Completed   DEXA  SCAN  Completed   Hepatitis C Screening  Completed   Zoster Vaccines- Shingrix  Completed   HPV VACCINES  Aged Out   Health Maintenance There are no preventive care reminders to display for this patient.  Hepatitis C Screening: Completed 2016.  Vision Screening: Recommended annual ophthalmology exams for early detection of glaucoma and other  disorders of the eye.  Dental Screening: Recommended annual dental exams for proper oral hygiene.  Community Resource Referral / Chronic Care Management: CRR required this visit?  No   CCM required this visit?  No      Plan:     I have personally reviewed and noted the following in the patient's chart:   Medical and social history Use of alcohol, tobacco or illicit drugs  Current medications and supplements including opioid prescriptions. Patient is not currently taking opioid prescriptions. Functional ability and status Nutritional status Physical activity Advanced directives List of other physicians Hospitalizations, surgeries, and ER visits in previous 12 months Vitals Screenings to include cognitive, depression, and falls Referrals and appointments  In addition, I have reviewed and discussed with patient certain preventive protocols, quality metrics, and best practice recommendations. A written personalized care plan for preventive services as well as general preventive health recommendations were provided to patient.     [place], LPN   32/04/1915      I have reviewed the above information and agree with above.   Deborra Medina, MD

## 2022-08-08 LAB — HM DIABETES EYE EXAM

## 2022-09-21 ENCOUNTER — Ambulatory Visit (INDEPENDENT_AMBULATORY_CARE_PROVIDER_SITE_OTHER): Payer: Medicare Other | Admitting: Internal Medicine

## 2022-09-21 ENCOUNTER — Telehealth: Payer: Self-pay

## 2022-09-21 ENCOUNTER — Encounter: Payer: Self-pay | Admitting: Internal Medicine

## 2022-09-21 VITALS — BP 122/68 | HR 70 | Temp 97.6°F | Ht 64.0 in | Wt 179.4 lb

## 2022-09-21 DIAGNOSIS — I10 Essential (primary) hypertension: Secondary | ICD-10-CM | POA: Diagnosis not present

## 2022-09-21 DIAGNOSIS — E785 Hyperlipidemia, unspecified: Secondary | ICD-10-CM

## 2022-09-21 DIAGNOSIS — E119 Type 2 diabetes mellitus without complications: Secondary | ICD-10-CM

## 2022-09-21 DIAGNOSIS — E039 Hypothyroidism, unspecified: Secondary | ICD-10-CM

## 2022-09-21 LAB — TSH: TSH: 3.65 u[IU]/mL (ref 0.35–5.50)

## 2022-09-21 MED ORDER — ATORVASTATIN CALCIUM 20 MG PO TABS: 20.0000 mg | ORAL_TABLET | Freq: Every day | ORAL | 3 refills | Status: DC

## 2022-09-21 MED ORDER — SERTRALINE HCL 50 MG PO TABS: 50.0000 mg | ORAL_TABLET | Freq: Every day | ORAL | 3 refills | Status: DC

## 2022-09-21 NOTE — Telephone Encounter (Signed)
At check-out today, I scheduled patient for a lab visit in March and a physical with Dr. Deborra Medina in July.  I let patient know that Adair Laundry, CMA, will check with Dr. Derrel Nip regarding the 20 day follow-up and call her to schedule.

## 2022-09-21 NOTE — Progress Notes (Unsigned)
Subjective:  Patient ID: Amanda Everett, female    DOB: 04/21/1951  Age: 72 y.o. MRN: [id number]  CC: The primary encounter diagnosis was Essential hypertension. Diagnoses of Diabetes mellitus type 2, diet-controlled ([place]), Acquired hypothyroidism, and Hyperlipidemia LDL goal <100 were also pertinent to this visit.   HPI Birdia Jaycox presents for follow up on chronic conditions  Chief Complaint  Patient presents with   Medical Management of Chronic Issues    3 month follow up on diabetes   1) HTN:  Patient is taking her medications as prescribed and notes no adverse effects.  Home BP readings have been done about once per week and are  generally < 130/80 .  She is avoiding added salt in her diet and walking regularly about 3 times per week for exercise  .   2)  Type 2 DM:  She feels generally well, is exercising several times per week and checking blood sugars once daily at variable times.  BS have been under 130 fasting and < 150 post prandially.  Denies any recent hypoglyemic events. Following a carbohydrate modified diet 6 days per week. Denies numbness, burning and tingling of extremities. Appetite is good.  Trying to lose weight but not successful thus far.   3) Hypothyroid:       Outpatient Medications Prior to Visit  Medication Sig Dispense Refill   aspirin EC 81 MG tablet Take 81 mg by mouth daily.     Cholecalciferol (VITAMIN D3) 3000 units TABS Take 1,000 Units by mouth daily.      diphenhydrAMINE (BENADRYL) 25 MG tablet Take 50 mg by mouth as needed for itching.      levothyroxine (SYNTHROID) 50 MCG tablet TAKE 1 TABLET BY MOUTH DAILY 90 tablet 3   losartan-hydrochlorothiazide (HYZAAR) 50-12.5 MG tablet TAKE 1 TABLET BY MOUTH DAILY 90 tablet 3   Naproxen Sod-diphenhydrAMINE (ALEVE PM) 220-25 MG TABS Take 1 tablet by mouth at bedtime.     Probiotic Product (PROBIOTIC DAILY) CAPS Take 1 capsule by mouth daily.     atorvastatin (LIPITOR) 20 MG tablet Take 1 tablet (20 mg  total) by mouth daily. 90 tablet 3   sertraline (ZOLOFT) 50 MG tablet Take 1 tablet (50 mg total) by mouth daily. 90 tablet 0   triamcinolone cream (KENALOG) 0.1 % Apply 1 application. topically 2 (two) times daily. As needed for lichen planus (Patient not taking: Reported on 09/21/2022) 30 g 0   No facility-administered medications prior to visit.    Review of Systems;  Patient denies headache, fevers, malaise, unintentional weight loss, skin rash, eye pain, sinus congestion and sinus pain, sore throat, dysphagia,  hemoptysis , cough, dyspnea, wheezing, chest pain, palpitations, orthopnea, edema, abdominal pain, nausea, melena, diarrhea, constipation, flank pain, dysuria, hematuria, urinary  Frequency, nocturia, numbness, tingling, seizures,  Focal weakness, Loss of consciousness,  Tremor, insomnia, depression, anxiety, and suicidal ideation.      Objective:  BP 122/68   Pulse 70   Temp 97.6 F (36.4 C) (Oral)   Ht '5\' 4"'$  (1.626 m)   Wt 179 lb 6.4 oz (81.4 kg)   SpO2 99%   BMI 30.79 kg/m   BP Readings from Last 3 Encounters:  09/21/22 122/68  06/20/22 124/80  05/18/22 120/74    Wt Readings from Last 3 Encounters:  09/21/22 179 lb 6.4 oz (81.4 kg)  06/29/22 178 lb (80.7 kg)  06/20/22 178 lb 3.2 oz (80.8 kg)    Physical Exam Vitals reviewed.  Constitutional:  General: She is not in acute distress.    Appearance: Normal appearance. She is normal weight. She is not ill-appearing, toxic-appearing or diaphoretic.  HENT:     Head: Normocephalic.  Eyes:     General: No scleral icterus.       Right eye: No discharge.        Left eye: No discharge.     Conjunctiva/sclera: Conjunctivae normal.  Cardiovascular:     Rate and Rhythm: Normal rate and regular rhythm.     Heart sounds: Normal heart sounds.  Pulmonary:     Effort: Pulmonary effort is normal. No respiratory distress.     Breath sounds: Normal breath sounds.  Musculoskeletal:        General: Normal range of  motion.  Skin:    General: Skin is warm and dry.  Neurological:     General: No focal deficit present.     Mental Status: She is alert and oriented to person, place, and time. Mental status is at baseline.  Psychiatric:        Mood and Affect: Mood normal.        Behavior: Behavior normal.        Thought Content: Thought content normal.        Judgment: Judgment normal.     Lab Results  Component Value Date   HGBA1C 6.7 (H) 05/18/2022   HGBA1C 6.4 11/12/2021   HGBA1C 6.3 02/26/2019    Lab Results  Component Value Date   CREATININE 1.01 05/18/2022   CREATININE 0.88 11/12/2021   CREATININE 0.78 02/26/2019    Lab Results  Component Value Date   WBC 7.9 11/12/2021   HGB 13.3 11/12/2021   HCT 39.9 11/12/2021   PLT 208.0 11/12/2021   GLUCOSE 130 (H) 05/18/2022   CHOL 196 05/18/2022   TRIG 100.0 05/18/2022   HDL 74.80 05/18/2022   LDLDIRECT 98.0 05/18/2022   LDLCALC 101 (H) 05/18/2022   ALT 18 06/20/2022   AST 20 06/20/2022   NA 139 05/18/2022   K 4.2 05/18/2022   CL 100 05/18/2022   CREATININE 1.01 05/18/2022   BUN 20 05/18/2022   CO2 33 (H) 05/18/2022   TSH 3.65 09/21/2022   HGBA1C 6.7 (H) 05/18/2022   MICROALBUR <0.7 11/12/2021    No results found.  Assessment & Plan:  .Essential hypertension -     Comprehensive metabolic panel; Future -     Microalbumin / creatinine urine ratio; Future  Diabetes mellitus type 2, diet-controlled ([place]) Assessment & Plan: Well controlled with diet despite being  lost to follow up since July 2020.  She is taking an ARB and a  statin  Lab Results  Component Value Date   HGBA1C 6.7 (H) 05/18/2022   Lab Results  Component Value Date   ALT 18 06/20/2022   AST 20 06/20/2022   ALKPHOS 38 (L) 06/20/2022   BILITOT 0.7 06/20/2022     Orders: -     Hemoglobin A1c; Future -     Comprehensive metabolic panel; Future -     Microalbumin / creatinine urine ratio; Future  Acquired hypothyroidism Assessment & Plan: TSH is >  3.0 on current dose of levothyroxine50 mcg daily.  Will add an extra dose per week and recheck in 6 weeks.    Orders: -     TSH; Future -     TSH  Hyperlipidemia LDL goal <100 -     Lipid panel; Future -     LDL cholesterol, direct; Future  Other orders -     Atorvastatin Calcium; Take 1 tablet (20 mg total) by mouth daily.  Dispense: 90 tablet; Refill: 3 -     Sertraline HCl; Take 1 tablet (50 mg total) by mouth daily.  Dispense: 90 tablet; Refill: 3     I provided 30 minutes of face-to-face time during this encounter reviewing patient's last visit with me, patient's  most recent visit with cardiology,  nephrology,  and neurology,  recent surgical and non surgical procedures, previous  labs and imaging studies, counseling on currently addressed issues,  and post visit ordering to diagnostics and therapeutics .   Follow-up: Return in about 6 months (around 03/22/2023) for follow up diabetes.   Crecencio Mc, MD

## 2022-09-21 NOTE — Patient Instructions (Signed)
I will adjust your levothyroxine dose based on today's TSH (goal is 1.0)  Return in  mid TO LATE mARCH For A1c and fasting labs  Increase your exercise intensity if you do not see a change in weight over the next 2 weeks

## 2022-09-21 NOTE — Telephone Encounter (Signed)
Pt wasn't sure that the 20 day follow up was what was meant for her. When does pt need to follow up?

## 2022-09-22 NOTE — Assessment & Plan Note (Signed)
Well controlled with diet despite being  lost to follow up since July 2020.  She is taking an ARB and a  statin  Lab Results  Component Value Date   HGBA1C 6.7 (H) 05/18/2022   Lab Results  Component Value Date   ALT 18 06/20/2022   AST 20 06/20/2022   ALKPHOS 38 (L) 06/20/2022   BILITOT 0.7 06/20/2022

## 2022-09-22 NOTE — Assessment & Plan Note (Signed)
TSH is > 3.0 on current dose of levothyroxine50 mcg daily.  Will add an extra dose per week and recheck in 6 weeks.

## 2022-09-22 NOTE — Telephone Encounter (Signed)
Pt called back. Call was transferred.

## 2022-09-22 NOTE — Telephone Encounter (Signed)
LM for pt to advise

## 2022-09-22 NOTE — Telephone Encounter (Signed)
Pt advised.

## 2022-11-07 ENCOUNTER — Other Ambulatory Visit: Payer: Medicare Other

## 2022-11-09 ENCOUNTER — Other Ambulatory Visit: Payer: Self-pay | Admitting: Internal Medicine

## 2022-11-22 ENCOUNTER — Other Ambulatory Visit (INDEPENDENT_AMBULATORY_CARE_PROVIDER_SITE_OTHER): Payer: Medicare Other

## 2022-11-22 DIAGNOSIS — E039 Hypothyroidism, unspecified: Secondary | ICD-10-CM

## 2022-11-22 DIAGNOSIS — E119 Type 2 diabetes mellitus without complications: Secondary | ICD-10-CM

## 2022-11-22 DIAGNOSIS — I10 Essential (primary) hypertension: Secondary | ICD-10-CM | POA: Diagnosis not present

## 2022-11-22 DIAGNOSIS — E785 Hyperlipidemia, unspecified: Secondary | ICD-10-CM | POA: Diagnosis not present

## 2022-11-22 LAB — COMPREHENSIVE METABOLIC PANEL
ALT: 21 U/L (ref 0–35)
AST: 23 U/L (ref 0–37)
Albumin: 4.3 g/dL (ref 3.5–5.2)
Alkaline Phosphatase: 36 U/L — ABNORMAL LOW (ref 39–117)
BUN: 19 mg/dL (ref 6–23)
CO2: 30 mEq/L (ref 19–32)
Calcium: 9.2 mg/dL (ref 8.4–10.5)
Chloride: 101 mEq/L (ref 96–112)
Creatinine, Ser: 0.85 mg/dL (ref 0.40–1.20)
GFR: 68.91 mL/min (ref >60.00)
Glucose, Bld: 87 mg/dL (ref 70–99)
Potassium: 3.4 mEq/L — ABNORMAL LOW (ref 3.5–5.1)
Sodium: 138 mEq/L (ref 135–145)
Total Bilirubin: 0.8 mg/dL (ref 0.2–1.2)
Total Protein: 7 g/dL (ref 6.0–8.3)

## 2022-11-22 LAB — LDL CHOLESTEROL, DIRECT: Direct LDL: 90 mg/dL

## 2022-11-22 LAB — HEMOGLOBIN A1C: Hgb A1c MFr Bld: 6.2 % (ref 4.6–6.5)

## 2022-11-22 LAB — TSH: TSH: 3.97 u[IU]/mL (ref 0.35–5.50)

## 2022-11-22 LAB — LIPID PANEL
Cholesterol: 196 mg/dL (ref 0–200)
HDL: 73.9 mg/dL (ref >39.00)
LDL Cholesterol: 96 mg/dL (ref 0–99)
NonHDL: 122.08
Total CHOL/HDL Ratio: 3
Triglycerides: 130 mg/dL (ref 0.0–149.0)
VLDL: 26 mg/dL (ref 0.0–40.0)

## 2022-11-22 NOTE — Addendum Note (Signed)
Addended by: Neta Ehlers on: 11/22/2022 09:38 AM   Modules accepted: Orders

## 2022-11-24 LAB — MICROALBUMIN / CREATININE URINE RATIO
Creatinine,U: 65.6 mg/dL
Microalb Creat Ratio: 1.1 mg/g (ref 0.0–30.0)
Microalb, Ur: <0.7 mg/dL (ref 0.0–1.9)

## 2023-02-27 ENCOUNTER — Encounter: Payer: Self-pay | Admitting: Internal Medicine

## 2023-02-27 ENCOUNTER — Ambulatory Visit (INDEPENDENT_AMBULATORY_CARE_PROVIDER_SITE_OTHER): Payer: Medicare Other | Admitting: Internal Medicine

## 2023-02-27 DIAGNOSIS — E119 Type 2 diabetes mellitus without complications: Secondary | ICD-10-CM

## 2023-02-27 DIAGNOSIS — B3789 Other sites of candidiasis: Secondary | ICD-10-CM | POA: Diagnosis not present

## 2023-02-27 DIAGNOSIS — R7303 Prediabetes: Secondary | ICD-10-CM

## 2023-02-27 DIAGNOSIS — R748 Abnormal levels of other serum enzymes: Secondary | ICD-10-CM | POA: Diagnosis not present

## 2023-02-27 DIAGNOSIS — E559 Vitamin D deficiency, unspecified: Secondary | ICD-10-CM | POA: Diagnosis not present

## 2023-02-27 DIAGNOSIS — R4689 Other symptoms and signs involving appearance and behavior: Secondary | ICD-10-CM | POA: Insufficient documentation

## 2023-02-27 DIAGNOSIS — E785 Hyperlipidemia, unspecified: Secondary | ICD-10-CM | POA: Diagnosis not present

## 2023-02-27 DIAGNOSIS — E663 Overweight: Secondary | ICD-10-CM | POA: Diagnosis not present

## 2023-02-27 DIAGNOSIS — I10 Essential (primary) hypertension: Secondary | ICD-10-CM

## 2023-02-27 DIAGNOSIS — Z1231 Encounter for screening mammogram for malignant neoplasm of breast: Secondary | ICD-10-CM

## 2023-02-27 DIAGNOSIS — E039 Hypothyroidism, unspecified: Secondary | ICD-10-CM | POA: Diagnosis not present

## 2023-02-27 LAB — COMPREHENSIVE METABOLIC PANEL
ALT: 23 U/L (ref 0–35)
AST: 24 U/L (ref 0–37)
Albumin: 4.4 g/dL (ref 3.5–5.2)
Alkaline Phosphatase: 32 U/L — ABNORMAL LOW (ref 39–117)
BUN: 15 mg/dL (ref 6–23)
CO2: 30 mEq/L (ref 19–32)
Calcium: 9.6 mg/dL (ref 8.4–10.5)
Chloride: 101 mEq/L (ref 96–112)
Creatinine, Ser: 0.86 mg/dL (ref 0.40–1.20)
GFR: 67.82 mL/min (ref >60.00)
Glucose, Bld: 104 mg/dL — ABNORMAL HIGH (ref 70–99)
Potassium: 3.7 mEq/L (ref 3.5–5.1)
Sodium: 140 mEq/L (ref 135–145)
Total Bilirubin: 0.7 mg/dL (ref 0.2–1.2)
Total Protein: 7.3 g/dL (ref 6.0–8.3)

## 2023-02-27 LAB — LDL CHOLESTEROL, DIRECT: Direct LDL: 84 mg/dL

## 2023-02-27 LAB — LIPID PANEL
Cholesterol: 190 mg/dL (ref 0–200)
HDL: 71.7 mg/dL (ref >39.00)
LDL Cholesterol: 88 mg/dL (ref 0–99)
NonHDL: 118.64
Total CHOL/HDL Ratio: 3
Triglycerides: 152 mg/dL — ABNORMAL HIGH (ref 0.0–149.0)
VLDL: 30.4 mg/dL (ref 0.0–40.0)

## 2023-02-27 LAB — VITAMIN D 25 HYDROXY (VIT D DEFICIENCY, FRACTURES): VITD: 46.07 ng/mL (ref 30.00–100.00)

## 2023-02-27 LAB — HEMOGLOBIN A1C: Hgb A1c MFr Bld: 6.1 % (ref 4.6–6.5)

## 2023-02-27 MED ORDER — NYSTATIN 100000 UNIT/GM EX POWD: 1.0000 | Freq: Two times a day (BID) | CUTANEOUS | 0 refills | Status: DC

## 2023-02-27 MED ORDER — LOSARTAN POTASSIUM-HCTZ 50-12.5 MG PO TABS: 1.0000 | ORAL_TABLET | Freq: Every day | ORAL | 3 refills | Status: DC

## 2023-02-27 MED ORDER — FLUCONAZOLE 150 MG PO TABS
150.0000 mg | ORAL_TABLET | Freq: Every day | ORAL | 0 refills | Status: DC
Start: 2023-02-27 — End: 2023-08-30

## 2023-02-27 NOTE — Assessment & Plan Note (Signed)
I have congratulated her in reduction of   BMI and encouraged  Continued weight loss with goal of 10% of body weigh over the next 6 months using a low glycemic index diet and regular exercise a minimum of 5 days per week.    

## 2023-02-27 NOTE — Assessment & Plan Note (Signed)
She continues to decline preventive screening (mammogram and colonoscopy) because of her husband Tony's battle with cancer

## 2023-02-27 NOTE — Assessment & Plan Note (Signed)
Well controlled with diet despite being  lost to follow up since July 2020.  She is taking an ARB and a  statin  Lab Results  Component Value Date   HGBA1C 6.2 11/22/2022   Lab Results  Component Value Date   ALT 21 11/22/2022   AST 23 11/22/2022   ALKPHOS 36 (L) 11/22/2022   BILITOT 0.8 11/22/2022

## 2023-02-27 NOTE — Assessment & Plan Note (Signed)
Rx Fluconazole 150 mg every day x 2,  nystatin powder bid.

## 2023-02-27 NOTE — Patient Instructions (Addendum)
Your annual mammogram has not been done since 2020.  Amanda Everett will not allow [place] to schedule it for you,  so please  call to make your appointment [phone]   The rash in your groin is due to yeast.  I am prescribing Fluconazole by mouth for 2 days. And Nystatin powder apply twice daily until rash resolves.  Once resolved, start using [place] powder with zinc  after your bath/shower to prevent recurrence

## 2023-02-27 NOTE — Progress Notes (Unsigned)
Patient ID: Amanda Everett, female    DOB: 1951/05/16  Age: 72 y.o. MRN: [id number]  The patient is here for follow up and  management of other chronic and acute problems.   The risk factors are reflected in the social history.  The roster of all physicians providing medical care to patient - is listed in the Snapshot section of the chart.  Activities of daily living:  The patient is 100% independent in all ADLs: dressing, toileting, feeding as well as independent mobility  Home safety : The patient has smoke detectors in the home. They wear seatbelts.  There are no firearms at home. There is no violence in the home.   There is no risks for hepatitis, STDs or HIV. There is no   history of blood transfusion. They have no travel history to infectious disease endemic areas of the world.  The patient has seen their dentist in the last six month. They have seen their eye doctor in the last year. They admit to slight hearing difficulty with regard to whispered voices and some television programs.  They have deferred audiologic testing in the last year.  They do not  have excessive sun exposure. Discussed the need for sun protection: hats, long sleeves and use of sunscreen if there is significant sun exposure.   Diet: the importance of a healthy diet is discussed. They do have a healthy diet.  The benefits of regular aerobic exercise were discussed. She walks 4 times per week ,  20 minutes.   Depression screen: there are no signs or vegative symptoms of depression- irritability, change in appetite, anhedonia, sadness/tearfullness. HOwever, she remains very anxious about her husband's health issues and medical conditions (prostate CA) and is neglecting her own health   Cognitive assessment: the patient manages all their financial and personal affairs and is actively engaged. They could relate day,date,year and events; recalled 2/3 objects at 3 minutes; performed clock-face test normally.  The following  portions of the patient's history were reviewed and updated as appropriate: allergies, current medications, past family history, past medical history,  past surgical history, past social history  and problem list.  Visual acuity was not assessed per patient preference since she has regular follow up with her ophthalmologist. Hearing and body mass index were assessed and reviewed.   During the course of the visit the patient was educated and counseled about appropriate screening and preventive services including : fall prevention , diabetes screening, nutrition counseling, colorectal cancer screening, and recommended immunizations.    CC: The primary encounter diagnosis was Essential hypertension. Diagnoses of Diabetes mellitus type 2, diet-controlled (HCC), Hyperlipidemia LDL goal <100, Self neglect, Vitamin D deficiency, Prediabetes, Breast cancer screening by mammogram, Overweight (BMI 25.0-29.9), Candida rash of groin, Acquired hypothyroidism, and Elevated liver enzymes were also pertinent to this visit.  1) Anxiety:  husband is receiving Lupron for prostate CA and had ankle surgery In April ,  has been non weight bearing  since then. He r dog has had 2 surgeries in the past month for  excision of mast cell tumors.  2) pruritic rash isolated to groin   3) selg neglect:  she continues to defer all screenings for herself while her husband is receiving treatment for prostate CA      History Amanda Everett has a past medical history of Chicken pox, Colon polyps, Diabetes mellitus without complication (HCC), Heart murmur, Shingles, Thyroid disease, and UTI (urinary tract infection).   She has a past surgical history that includes  Tubal ligation; Colonoscopy (2013); and Colonoscopy with propofol (N/A, 04/12/2017).   Her family history includes Alcohol abuse in her father; Arthritis in her maternal grandmother; Cancer (age of onset: 24) in her father; Cancer (age of onset: 47) in her mother; Heart disease in her  maternal grandmother; Stroke in her mother.She reports that she quit smoking about 6 years ago. Her smoking use included cigarettes. She has a 17.50 pack-year smoking history. She has never used smokeless tobacco. She reports current alcohol use of about 1.0 standard drink of alcohol per week. She reports that she does not use drugs.  Outpatient Medications Prior to Visit  Medication Sig Dispense Refill   aspirin EC 81 MG tablet Take 81 mg by mouth daily.     atorvastatin (LIPITOR) 20 MG tablet Take 1 tablet (20 mg total) by mouth daily. 90 tablet 3   Cholecalciferol (VITAMIN D3) 3000 units TABS Take 1,000 Units by mouth daily.      diphenhydrAMINE (BENADRYL) 25 MG tablet Take 50 mg by mouth as needed for itching.      levothyroxine (SYNTHROID) 50 MCG tablet TAKE 1 TABLET BY MOUTH DAILY 90 tablet 3   Naproxen Sod-diphenhydrAMINE (ALEVE PM) 220-25 MG TABS Take 1 tablet by mouth at bedtime.     Probiotic Product (PROBIOTIC DAILY) CAPS Take 1 capsule by mouth daily.     sertraline (ZOLOFT) 50 MG tablet Take 1 tablet (50 mg total) by mouth daily. 90 tablet 3   losartan-hydrochlorothiazide (HYZAAR) 50-12.5 MG tablet TAKE 1 TABLET BY MOUTH DAILY 90 tablet 3   No facility-administered medications prior to visit.    Review of Systems  Patient denies headache, fevers, malaise, unintentional weight loss, eye pain, sinus congestion and sinus pain, sore throat, dysphagia,  hemoptysis , cough, dyspnea, wheezing, chest pain, palpitations, orthopnea, edema, abdominal pain, nausea, melena, diarrhea, constipation, flank pain, dysuria, hematuria, urinary  Frequency, nocturia, numbness, tingling, seizures,  Focal weakness, Loss of consciousness,  Tremor, insomnia, depression, anxiety, and suicidal ideation.    Objective:  There were no vitals taken for this visit.  Physical Exam Vitals reviewed.  Constitutional:      General: She is not in acute distress.    Appearance: Normal appearance. She is  well-developed and normal weight. She is not ill-appearing, toxic-appearing or diaphoretic.  HENT:     Head: Normocephalic.     Right Ear: Tympanic membrane, ear canal and external ear normal. There is no impacted cerumen.     Left Ear: Tympanic membrane, ear canal and external ear normal. There is no impacted cerumen.     Nose: Nose normal.     Mouth/Throat:     Mouth: Mucous membranes are moist.     Pharynx: Oropharynx is clear.  Eyes:     General: No scleral icterus.       Right eye: No discharge.        Left eye: No discharge.     Conjunctiva/sclera: Conjunctivae normal.     Pupils: Pupils are equal, round, and reactive to light.  Neck:     Thyroid: No thyromegaly.     Vascular: No carotid bruit or JVD.  Cardiovascular:     Rate and Rhythm: Normal rate and regular rhythm.     Heart sounds: Normal heart sounds.  Pulmonary:     Effort: Pulmonary effort is normal. No respiratory distress.     Breath sounds: Normal breath sounds.  Chest:  Breasts:    Breasts are symmetrical.     Right:  Normal. No swelling, inverted nipple, mass, nipple discharge, skin change or tenderness.     Left: Normal. No swelling, inverted nipple, mass, nipple discharge, skin change or tenderness.  Abdominal:     General: Bowel sounds are normal.     Palpations: Abdomen is soft. There is no mass.     Tenderness: There is no abdominal tenderness. There is no guarding or rebound.  Musculoskeletal:        General: Normal range of motion.     Cervical back: Normal range of motion and neck supple.  Lymphadenopathy:     Cervical: No cervical adenopathy.     Upper Body:     Right upper body: No supraclavicular, axillary or pectoral adenopathy.     Left upper body: No supraclavicular, axillary or pectoral adenopathy.  Skin:    General: Skin is warm and dry.  Neurological:     General: No focal deficit present.     Mental Status: She is alert and oriented to person, place, and time. Mental status is at  baseline.  Psychiatric:        Mood and Affect: Mood normal.        Behavior: Behavior normal.        Thought Content: Thought content normal.        Judgment: Judgment normal.    Assessment & Plan:  Essential hypertension -     Comprehensive metabolic panel  Diabetes mellitus type 2, diet-controlled (HCC) Assessment & Plan: Remains  controlled with diet alone despite being  lost to follow up since July 2020.  She is taking an ARB and a  statin . Lab Results  Component Value Date   HGBA1C 6.1 02/27/2023   Lab Results  Component Value Date   ALT 23 02/27/2023   AST 24 02/27/2023   ALKPHOS 32 (L) 02/27/2023   BILITOT 0.7 02/27/2023     Orders: -     Comprehensive metabolic panel  Hyperlipidemia LDL goal <100 -     Lipid panel -     LDL cholesterol, direct  Self neglect Assessment & Plan: She continues to decline preventive screening (mammogram and colonoscopy) because of her husband Tony's battle with cancer    Vitamin D deficiency -     VITAMIN D 25 Hydroxy (Vit-D Deficiency, Fractures)  Prediabetes -     Hemoglobin A1c  Breast cancer screening by mammogram -     3D Screening Mammogram, Left and Right; Future  Overweight (BMI 25.0-29.9) Assessment & Plan: I have congratulated her in reduction of   BMI and encouraged  Continued weight loss with goal of 10% of body weigh over the next 6 months using a low glycemic index diet and regular exercise a minimum of 5 days per week.     Candida rash of groin Assessment & Plan: Rx Fluconazole 150 mg every day x 2,  nystatin powder bid.     Acquired hypothyroidism Assessment & Plan: Thyroid function is WNL on current dose of levothyrxine 50 mcg .  No current changes needed.    Lab Results  Component Value Date   TSH 3.97 11/22/2022      Elevated liver enzymes Assessment & Plan: Etiology unclear , but she is taking a statin . Repeat LFts are nomal.    Lab Results  Component Value Date   ALT 23 02/27/2023    AST 24 02/27/2023   ALKPHOS 32 (L) 02/27/2023   BILITOT 0.7 02/27/2023      Other orders -  Losartan Potassium-HCTZ; Take 1 tablet by mouth daily.  Dispense: 90 tablet; Refill: 3 -     Fluconazole; Take 1 tablet (150 mg total) by mouth daily.  Dispense: 2 tablet; Refill: 0 -     Nystatin; Apply 1 Application topically 2 (two) times daily. To rash until resolved.  Dispense: 15 g; Refill: 0      I provided 30 minutes of  face-to-face time during this encounter reviewing patient's current problems a recent labs and iaging studies, providing counseling on the above mentioned problems , and coordination  of care .   Follow-up: Return in about 6 months (around 08/30/2023).   Sherlene Shams, MD

## 2023-02-28 NOTE — Assessment & Plan Note (Signed)
Thyroid function is WNL on current dose of levothyrxine 50 mcg .  No current changes needed.    Lab Results  Component Value Date   TSH 3.97 11/22/2022

## 2023-02-28 NOTE — Assessment & Plan Note (Signed)
Etiology unclear , but she is taking a statin . Repeat LFts are nomal.    Lab Results  Component Value Date   ALT 23 02/27/2023   AST 24 02/27/2023   ALKPHOS 32 (L) 02/27/2023   BILITOT 0.7 02/27/2023

## 2023-03-11 ENCOUNTER — Other Ambulatory Visit: Payer: Self-pay | Admitting: Family

## 2023-05-08 ENCOUNTER — Telehealth: Payer: Self-pay | Admitting: Internal Medicine

## 2023-05-08 NOTE — Telephone Encounter (Signed)
Amanda Everett called from [facility] stating she need a TEFL teacher for levothyroxine  Reference #[id number]

## 2023-05-08 NOTE — Telephone Encounter (Signed)
Approval for manufacturer change has been given

## 2023-06-13 ENCOUNTER — Telehealth: Payer: Self-pay | Admitting: Internal Medicine

## 2023-06-13 MED ORDER — LOSARTAN POTASSIUM-HCTZ 50-12.5 MG PO TABS: 1.0000 | ORAL_TABLET | Freq: Every day | ORAL | 1 refills | Status: DC

## 2023-06-13 NOTE — Addendum Note (Signed)
Addended by: Sandy Salaam on: 06/13/2023 04:08 PM   Modules accepted: Orders

## 2023-06-13 NOTE — Telephone Encounter (Signed)
Patient called would like a med refill on losartan-hydrochlorothiazide (HYZAAR) 50-12.5 MG tablet Please send to [facility]   LOV 02/27/23

## 2023-06-13 NOTE — Telephone Encounter (Signed)
Medication refilled

## 2023-07-31 ENCOUNTER — Telehealth: Payer: Self-pay | Admitting: Internal Medicine

## 2023-07-31 NOTE — Telephone Encounter (Signed)
Copied from CRM [id number]. Topic: Medicare AWV >> Jul 31, 2023 11:15 AM Payton Doughty wrote: Reason for CRM: Called LVM 07/31/2023 to schedule Annual Wellness Visit  Verlee Rossetti; Care Guide Ambulatory Clinical Support [facility] Direct Dial: [phone]

## 2023-08-03 ENCOUNTER — Other Ambulatory Visit: Payer: Self-pay | Admitting: Internal Medicine

## 2023-08-27 ENCOUNTER — Other Ambulatory Visit: Payer: Self-pay | Admitting: Internal Medicine

## 2023-08-30 ENCOUNTER — Encounter: Payer: Self-pay | Admitting: Internal Medicine

## 2023-08-30 ENCOUNTER — Ambulatory Visit: Payer: Medicare Other | Admitting: Internal Medicine

## 2023-08-30 VITALS — BP 140/82 | HR 59 | Ht 64.0 in | Wt 178.0 lb

## 2023-08-30 DIAGNOSIS — E785 Hyperlipidemia, unspecified: Secondary | ICD-10-CM

## 2023-08-30 DIAGNOSIS — E663 Overweight: Secondary | ICD-10-CM

## 2023-08-30 DIAGNOSIS — Z1211 Encounter for screening for malignant neoplasm of colon: Secondary | ICD-10-CM

## 2023-08-30 DIAGNOSIS — Z87898 Personal history of other specified conditions: Secondary | ICD-10-CM

## 2023-08-30 DIAGNOSIS — I10 Essential (primary) hypertension: Secondary | ICD-10-CM | POA: Diagnosis not present

## 2023-08-30 DIAGNOSIS — Z860100 Personal history of colon polyps, unspecified: Secondary | ICD-10-CM

## 2023-08-30 DIAGNOSIS — F329 Major depressive disorder, single episode, unspecified: Secondary | ICD-10-CM | POA: Diagnosis not present

## 2023-08-30 DIAGNOSIS — E039 Hypothyroidism, unspecified: Secondary | ICD-10-CM

## 2023-08-30 DIAGNOSIS — E119 Type 2 diabetes mellitus without complications: Secondary | ICD-10-CM | POA: Diagnosis not present

## 2023-08-30 DIAGNOSIS — R4689 Other symptoms and signs involving appearance and behavior: Secondary | ICD-10-CM | POA: Diagnosis not present

## 2023-08-30 LAB — CBC WITH DIFFERENTIAL/PLATELET
Basophils Absolute: 0 10*3/uL (ref 0.0–0.1)
Basophils Relative: 0.5 % (ref 0.0–3.0)
Eosinophils Absolute: 0.3 10*3/uL (ref 0.0–0.7)
Eosinophils Relative: 3.2 % (ref 0.0–5.0)
HCT: 41.3 % (ref 36.0–46.0)
Hemoglobin: 13.7 g/dL (ref 12.0–15.0)
Lymphocytes Relative: 30.5 % (ref 12.0–46.0)
Lymphs Abs: 2.5 10*3/uL (ref 0.7–4.0)
MCHC: 33.2 g/dL (ref 30.0–36.0)
MCV: 95.1 fL (ref 78.0–100.0)
Monocytes Absolute: 0.5 10*3/uL (ref 0.1–1.0)
Monocytes Relative: 6 % (ref 3.0–12.0)
Neutro Abs: 5 10*3/uL (ref 1.4–7.7)
Neutrophils Relative %: 59.8 % (ref 43.0–77.0)
Platelets: 220 10*3/uL (ref 150.0–400.0)
RBC: 4.34 Mil/uL (ref 3.87–5.11)
RDW: 13.8 % (ref 11.5–15.5)
WBC: 8.3 10*3/uL (ref 4.0–10.5)

## 2023-08-30 LAB — MICROALBUMIN / CREATININE URINE RATIO
Creatinine,U: 50.1 mg/dL
Microalb Creat Ratio: 1.4 mg/g (ref 0.0–30.0)
Microalb, Ur: <0.7 mg/dL (ref 0.0–1.9)

## 2023-08-30 LAB — LIPID PANEL
Cholesterol: 206 mg/dL — ABNORMAL HIGH (ref 0–200)
HDL: 64.1 mg/dL (ref >39.00)
LDL Cholesterol: 118 mg/dL — ABNORMAL HIGH (ref 0–99)
NonHDL: 141.83
Total CHOL/HDL Ratio: 3
Triglycerides: 120 mg/dL (ref 0.0–149.0)
VLDL: 24 mg/dL (ref 0.0–40.0)

## 2023-08-30 LAB — COMPREHENSIVE METABOLIC PANEL
ALT: 30 U/L (ref 0–35)
AST: 32 U/L (ref 0–37)
Albumin: 4.4 g/dL (ref 3.5–5.2)
Alkaline Phosphatase: 50 U/L (ref 39–117)
BUN: 20 mg/dL (ref 6–23)
CO2: 33 meq/L — ABNORMAL HIGH (ref 19–32)
Calcium: 9.3 mg/dL (ref 8.4–10.5)
Chloride: 100 meq/L (ref 96–112)
Creatinine, Ser: 0.76 mg/dL (ref 0.40–1.20)
GFR: 78.39 mL/min (ref >60.00)
Glucose, Bld: 103 mg/dL — ABNORMAL HIGH (ref 70–99)
Potassium: 4 meq/L (ref 3.5–5.1)
Sodium: 141 meq/L (ref 135–145)
Total Bilirubin: 0.6 mg/dL (ref 0.2–1.2)
Total Protein: 7.1 g/dL (ref 6.0–8.3)

## 2023-08-30 LAB — HEMOGLOBIN A1C: Hgb A1c MFr Bld: 6.5 % (ref 4.6–6.5)

## 2023-08-30 LAB — TSH: TSH: 5.09 u[IU]/mL (ref 0.35–5.50)

## 2023-08-30 LAB — LDL CHOLESTEROL, DIRECT: Direct LDL: 111 mg/dL

## 2023-08-30 MED ORDER — LEVOTHYROXINE SODIUM 75 MCG PO TABS: 75.0000 ug | ORAL_TABLET | Freq: Every day | ORAL | 0 refills | Status: DC

## 2023-08-30 MED ORDER — LOSARTAN POTASSIUM-HCTZ 50-12.5 MG PO TABS: 1.0000 | ORAL_TABLET | Freq: Every day | ORAL | 1 refills | Status: DC

## 2023-08-30 NOTE — Assessment & Plan Note (Signed)
 She has accepted referral for colonoscopy

## 2023-08-30 NOTE — Assessment & Plan Note (Addendum)
 Found previously on MRI of lungs, also noted in liver. .she continues to defer follow up imaging per discussion today

## 2023-08-30 NOTE — Assessment & Plan Note (Signed)
 Thyroid function is slighly underactive  on  levothyrxine 50 mcg .  Will increase dose to 40m mcg daily    Lab Results  Component Value Date   TSH 5.09 08/30/2023

## 2023-08-30 NOTE — Assessment & Plan Note (Addendum)
 Her diabetes remains controlled with diet alone. Repaeat labs are due,  eye exam due,  foot exam up to date.  Vaccines up to date .  She is taking an ARB and a  statin .  Lab Results  Component Value Date   HGBA1C 6.5 08/30/2023

## 2023-08-30 NOTE — Patient Instructions (Addendum)
 You can increase the sertraline to 1.5 tablets as a  trial for your anxiety  Referral  to [facility] [place] for colonoscopy

## 2023-08-30 NOTE — Assessment & Plan Note (Signed)
 Secondary to husband's health issues.  Improving with encouragement.  Gi referral made for overdue colonoscopy

## 2023-08-30 NOTE — Progress Notes (Signed)
 Subjective:  Patient ID: Amanda Everett, female    DOB: [birth year]  Age: 73 y.o. MRN: [id number]  CC: The primary encounter diagnosis was Essential hypertension. Diagnoses of Diabetes mellitus type 2, diet-controlled (HCC), Acquired hypothyroidism, Hyperlipidemia LDL goal <100, Overweight (BMI 25.0-29.9), Colon cancer screening, Self neglect, Major depressive disorder without psychotic features, and H/O multiple pulmonary nodules were also pertinent to this visit.   HPI Djuna Frechette presents for  Chief Complaint  Patient presents with   Medical Management of Chronic Issues    6 month follow up    1) anxiety aggravted her depression  due to husband's declining health due to intolerance of  prostate ca treatment and ongoing weight loss  . He is scheduled for EGD/colonoscopy but the procedure has been delayed due to insurance nonpayment of prep. thinks the sertraline  is helping .  Husband has told her she wakes up screaming  but she does not remember nightmares.   2) prediabetes and weight gain:  not exercising due to increaesd lresponsibilities at home   afraid to leave husband alone   3) HTN:  elevated today due to lack of sleep .  Patient is taking her medications as prescribed and notes no adverse effects.  Home BP readings have been done about once per week and are  generally < 130/80 .  She is avoiding added salt in her diet and walking regularly about 3 times per week for exercise  .  4) Self neglect:  she has deferred many of her own screenings but is slowly cathcing up Colonoscopy overdue, CT chest overdue     Outpatient Medications Prior to Visit  Medication Sig Dispense Refill   aspirin EC 81 MG tablet Take 81 mg by mouth daily.     atorvastatin  (LIPITOR) 20 MG tablet TAKE 1 TABLET BY MOUTH DAILY 90 tablet 3   Cholecalciferol (VITAMIN D3) 3000 units TABS Take 1,000 Units by mouth daily.      levothyroxine  (SYNTHROID ) 50 MCG tablet TAKE 1 TABLET BY MOUTH DAILY 90 tablet 3    Naproxen Sod-diphenhydrAMINE (ALEVE PM) 220-25 MG TABS Take 1 tablet by mouth at bedtime.     nystatin  (MYCOSTATIN /NYSTOP ) powder Apply 1 Application topically 2 (two) times daily. To rash until resolved. 15 g 0   Probiotic Product (PROBIOTIC DAILY) CAPS Take 1 capsule by mouth daily.     sertraline  (ZOLOFT ) 50 MG tablet TAKE 1 TABLET BY MOUTH DAILY 90 tablet 3   losartan -hydrochlorothiazide (HYZAAR) 50-12.5 MG tablet Take 1 tablet by mouth daily. 90 tablet 1   diphenhydrAMINE (BENADRYL) 25 MG tablet Take 50 mg by mouth as needed for itching.      fluconazole  (DIFLUCAN ) 150 MG tablet Take 1 tablet (150 mg total) by mouth daily. 2 tablet 0   No facility-administered medications prior to visit.    Review of Systems;  Patient denies headache, fevers, malaise, unintentional weight loss, skin rash, eye pain, sinus congestion and sinus pain, sore throat, dysphagia,  hemoptysis , cough, dyspnea, wheezing, chest pain, palpitations, orthopnea, edema, abdominal pain, nausea, melena, diarrhea, constipation, flank pain, dysuria, hematuria, urinary  Frequency, nocturia, numbness, tingling, seizures,  Focal weakness, Loss of consciousness,  Tremor, insomnia, depression, anxiety, and suicidal ideation.      Objective:  BP (!) 140/82   Pulse (!) 59   Ht 5' 4 (1.626 m)   Wt 178 lb (80.7 kg)   SpO2 96%   BMI 30.55 kg/m   BP Readings from Last 3 Encounters:  08/30/23 ROLLEN)  140/82  09/21/22 122/68  06/20/22 124/80    Wt Readings from Last 3 Encounters:  08/30/23 178 lb (80.7 kg)  09/21/22 179 lb 6.4 oz (81.4 kg)  06/29/22 178 lb (80.7 kg)    Physical Exam Vitals reviewed.  Constitutional:      General: She is not in acute distress.    Appearance: Normal appearance. She is normal weight. She is not ill-appearing, toxic-appearing or diaphoretic.  HENT:     Head: Normocephalic.  Eyes:     General: No scleral icterus.       Right eye: No discharge.        Left eye: No discharge.      Conjunctiva/sclera: Conjunctivae normal.  Cardiovascular:     Rate and Rhythm: Normal rate and regular rhythm.     Heart sounds: Normal heart sounds.  Pulmonary:     Effort: Pulmonary effort is normal. No respiratory distress.     Breath sounds: Normal breath sounds.  Musculoskeletal:        General: Normal range of motion.  Skin:    General: Skin is warm and dry.  Neurological:     General: No focal deficit present.     Mental Status: She is alert and oriented to person, place, and time. Mental status is at baseline.  Psychiatric:        Mood and Affect: Mood normal.        Behavior: Behavior normal.        Thought Content: Thought content normal.        Judgment: Judgment normal.   Lab Results  Component Value Date   HGBA1C 6.1 02/27/2023   HGBA1C 6.2 11/22/2022   HGBA1C 6.7 (H) 05/18/2022    Lab Results  Component Value Date   CREATININE 0.86 02/27/2023   CREATININE 0.85 11/22/2022   CREATININE 1.01 05/18/2022    Lab Results  Component Value Date   WBC 7.9 11/12/2021   HGB 13.3 11/12/2021   HCT 39.9 11/12/2021   PLT 208.0 11/12/2021   GLUCOSE 104 (H) 02/27/2023   CHOL 190 02/27/2023   TRIG 152.0 (H) 02/27/2023   HDL 71.70 02/27/2023   LDLDIRECT 84.0 02/27/2023   LDLCALC 88 02/27/2023   ALT 23 02/27/2023   AST 24 02/27/2023   NA 140 02/27/2023   K 3.7 02/27/2023   CL 101 02/27/2023   CREATININE 0.86 02/27/2023   BUN 15 02/27/2023   CO2 30 02/27/2023   TSH 3.97 11/22/2022   HGBA1C 6.1 02/27/2023   MICROALBUR <0.7 11/23/2022    No results found.  Assessment & Plan:  .Essential hypertension -     Comprehensive metabolic panel -     Microalbumin / creatinine urine ratio  Diabetes mellitus type 2, diet-controlled (HCC) Assessment & Plan: Historically  controlled with diet alone. Repaeat labs are due,  eye exam due,  fott exam up to date.  Vaccines up to date .  She is taking an ARB and a  statin .  Orders: -     Hemoglobin A1c -     Comprehensive  metabolic panel -     Microalbumin / creatinine urine ratio  Acquired hypothyroidism -     TSH  Hyperlipidemia LDL goal <100 -     Lipid panel -     LDL cholesterol, direct  Overweight (BMI 25.0-29.9) -     CBC with Differential/Platelet  Colon cancer screening -     Ambulatory referral to Gastroenterology  Self neglect Assessment & Plan:  Secondary to husband's health issues.  Improving with encouragement.  Gi referral made for overdue colonoscopy   Major depressive disorder without psychotic features Assessment & Plan: Encouarged to increase sertraline  to 75 mg daily    H/O multiple pulmonary nodules Assessment & Plan: Found previously on MRI of lungs, also noted in liver. .she continues to defer follow up imaging per discussion today   Other orders -     Losartan  Potassium-HCTZ; Take 1 tablet by mouth daily.  Dispense: 90 tablet; Refill: 1     I provided 30 minutes of face-to-face time during this encounter reviewing patient's last visit with me, recent surgical and non surgical procedures, previous  labs and imaging studies, counseling on currently addressed issues,  and post visit ordering to diagnostics and therapeutics .   Follow-up: No follow-ups on file.   Verneita LITTIE Kettering, MD

## 2023-08-30 NOTE — Assessment & Plan Note (Signed)
 Encouarged to increase sertraline to 75 mg daily

## 2023-09-01 ENCOUNTER — Telehealth: Payer: Self-pay

## 2023-09-01 NOTE — Telephone Encounter (Signed)
 Copied from CRM [id number]. Topic: Clinical - Lab/Test Results >> Sep 01, 2023  1:15 PM Corin V wrote: Reason for CRM: Patient called stating she was supposed to return in about 2 weeks to have labs done for a medication she was starting this week. No lab orders in chart. Please have Dr. Marylynn enter labs and call patient to schedule lab appointment.

## 2023-09-05 ENCOUNTER — Other Ambulatory Visit: Payer: Self-pay

## 2023-09-05 DIAGNOSIS — E039 Hypothyroidism, unspecified: Secondary | ICD-10-CM

## 2023-09-05 NOTE — Telephone Encounter (Signed)
 Spoke to pt. Pt needed a 2 month lab appt to check TSH. Lab appt was scheduled and lab placed.

## 2023-09-12 LAB — HM DIABETES EYE EXAM

## 2023-10-06 ENCOUNTER — Other Ambulatory Visit: Payer: Self-pay

## 2023-10-06 MED ORDER — LEVOTHYROXINE SODIUM 75 MCG PO TABS: 75.0000 ug | ORAL_TABLET | Freq: Every day | ORAL | 0 refills | Status: DC

## 2023-10-31 ENCOUNTER — Other Ambulatory Visit (INDEPENDENT_AMBULATORY_CARE_PROVIDER_SITE_OTHER): Payer: Medicare Other

## 2023-10-31 DIAGNOSIS — E039 Hypothyroidism, unspecified: Secondary | ICD-10-CM

## 2023-10-31 LAB — TSH: TSH: 1.29 u[IU]/mL (ref 0.35–5.50)

## 2023-11-01 ENCOUNTER — Encounter: Payer: Self-pay | Admitting: Internal Medicine

## 2023-11-01 NOTE — Assessment & Plan Note (Signed)
 Thyroid function is WNL on current dose of 75 mcg.  No current changes needed.    Lab Results  Component Value Date   TSH 1.29 10/31/2023

## 2023-12-28 ENCOUNTER — Other Ambulatory Visit: Payer: Self-pay | Admitting: Internal Medicine

## 2024-01-09 ENCOUNTER — Other Ambulatory Visit: Payer: Self-pay | Admitting: Internal Medicine

## 2024-02-29 ENCOUNTER — Ambulatory Visit (INDEPENDENT_AMBULATORY_CARE_PROVIDER_SITE_OTHER): Payer: Medicare Other | Admitting: Internal Medicine

## 2024-02-29 ENCOUNTER — Encounter: Payer: Self-pay | Admitting: Internal Medicine

## 2024-02-29 VITALS — BP 120/74 | HR 66 | Temp 98.0°F | Resp 20 | Ht 64.0 in | Wt 177.2 lb

## 2024-02-29 DIAGNOSIS — E538 Deficiency of other specified B group vitamins: Secondary | ICD-10-CM | POA: Diagnosis not present

## 2024-02-29 DIAGNOSIS — E785 Hyperlipidemia, unspecified: Secondary | ICD-10-CM | POA: Diagnosis not present

## 2024-02-29 DIAGNOSIS — D492 Neoplasm of unspecified behavior of bone, soft tissue, and skin: Secondary | ICD-10-CM

## 2024-02-29 DIAGNOSIS — E039 Hypothyroidism, unspecified: Secondary | ICD-10-CM | POA: Diagnosis not present

## 2024-02-29 DIAGNOSIS — I10 Essential (primary) hypertension: Secondary | ICD-10-CM

## 2024-02-29 DIAGNOSIS — Z800 Family history of malignant neoplasm of digestive organs: Secondary | ICD-10-CM

## 2024-02-29 DIAGNOSIS — Z1231 Encounter for screening mammogram for malignant neoplasm of breast: Secondary | ICD-10-CM

## 2024-02-29 DIAGNOSIS — E119 Type 2 diabetes mellitus without complications: Secondary | ICD-10-CM

## 2024-02-29 DIAGNOSIS — Z1211 Encounter for screening for malignant neoplasm of colon: Secondary | ICD-10-CM

## 2024-02-29 DIAGNOSIS — R748 Abnormal levels of other serum enzymes: Secondary | ICD-10-CM

## 2024-02-29 LAB — LDL CHOLESTEROL, DIRECT: Direct LDL: 98 mg/dL

## 2024-02-29 LAB — COMPREHENSIVE METABOLIC PANEL WITH GFR
ALT: 17 U/L (ref 0–35)
AST: 22 U/L (ref 0–37)
Albumin: 4.5 g/dL (ref 3.5–5.2)
Alkaline Phosphatase: 40 U/L (ref 39–117)
BUN: 18 mg/dL (ref 6–23)
CO2: 31 meq/L (ref 19–32)
Calcium: 9.4 mg/dL (ref 8.4–10.5)
Chloride: 102 meq/L (ref 96–112)
Creatinine, Ser: 0.87 mg/dL (ref 0.40–1.20)
GFR: 66.42 mL/min (ref >60.00)
Glucose, Bld: 115 mg/dL — ABNORMAL HIGH (ref 70–99)
Potassium: 3.8 meq/L (ref 3.5–5.1)
Sodium: 140 meq/L (ref 135–145)
Total Bilirubin: 0.7 mg/dL (ref 0.2–1.2)
Total Protein: 7.6 g/dL (ref 6.0–8.3)

## 2024-02-29 LAB — HEMOGLOBIN A1C: Hgb A1c MFr Bld: 6.5 % (ref 4.6–6.5)

## 2024-02-29 LAB — LIPID PANEL
Cholesterol: 206 mg/dL — ABNORMAL HIGH (ref 0–200)
HDL: 79.6 mg/dL (ref >39.00)
LDL Cholesterol: 99 mg/dL (ref 0–99)
NonHDL: 126
Total CHOL/HDL Ratio: 3
Triglycerides: 134 mg/dL (ref 0.0–149.0)
VLDL: 26.8 mg/dL (ref 0.0–40.0)

## 2024-02-29 LAB — MICROALBUMIN / CREATININE URINE RATIO
Creatinine,U: 85.5 mg/dL
Microalb Creat Ratio: UNDETERMINED mg/g (ref 0.0–30.0)
Microalb, Ur: <0.7 mg/dL

## 2024-02-29 MED ORDER — LOSARTAN POTASSIUM-HCTZ 50-12.5 MG PO TABS: 1.0000 | ORAL_TABLET | Freq: Every day | ORAL | 1 refills | Status: DC

## 2024-02-29 NOTE — Patient Instructions (Addendum)
  You are due for your tetanus-diptheria-pertussis vaccine   (TDaP)   You can get it at your pharmacy   Referral to [facility] is underway for the mole on  your left forearm

## 2024-02-29 NOTE — Progress Notes (Signed)
 Subjective:  Patient ID: Amanda Everett, female    DOB: 01/04/1951  Age: 73 y.o. MRN: [id number]  CC: The primary encounter diagnosis was Essential hypertension. Diagnoses of Diabetes mellitus type 2, diet-controlled (HCC), Hyperlipidemia LDL goal <100, Colon cancer screening, Family history of colon cancer, Encounter for screening mammogram for breast cancer, Skin neoplasm, Acquired hypothyroidism, B12 deficiency, and Elevated liver enzymes were also pertinent to this visit.   HPI Amanda Everett presents for  Chief Complaint  Patient presents with   Medical Management of Chronic Issues    6 month follow up   1( Type 2 DM:   She  feels generally well,  But is not  exercising regularly or trying to lose weight. Checking  blood sugars less than once daily at variable times, usually only if she feels she may be having a hypoglycemic event. .  BS have been under 130 fasting and < 150 post prandially.  Denies any recent hypoglyemic events.  Taking   medications as directed. Following a carbohydrate modified diet 6 days per week. Denies numbness, burning and tingling of extremities. Appetite is good.   2) Hypertension: patient checks blood pressure twice weekly at home.  Readings have been for the most part <130/80 at rest . Patient is following a reduced salt diet most days and is taking medications as prescribed   Outpatient Medications Prior to Visit  Medication Sig Dispense Refill   aspirin EC 81 MG tablet Take 81 mg by mouth daily.     atorvastatin  (LIPITOR) 20 MG tablet TAKE 1 TABLET BY MOUTH DAILY 90 tablet 3   Cholecalciferol (VITAMIN D3) 3000 units TABS Take 1,000 Units by mouth daily.      levothyroxine  (SYNTHROID ) 75 MCG tablet TAKE 1 TABLET BY MOUTH DAILY 90 tablet 1   Naproxen Sod-diphenhydrAMINE (ALEVE PM) 220-25 MG TABS Take 1 tablet by mouth at bedtime.     nystatin  (MYCOSTATIN /NYSTOP ) powder Apply 1 Application topically 2 (two) times daily. To rash until resolved. 15 g 0    Probiotic Product (PROBIOTIC DAILY) CAPS Take 1 capsule by mouth daily.     sertraline  (ZOLOFT ) 50 MG tablet TAKE 1 TABLET BY MOUTH DAILY 90 tablet 3   losartan -hydrochlorothiazide (HYZAAR) 50-12.5 MG tablet TAKE 1 TABLET BY MOUTH DAILY 90 tablet 0   No facility-administered medications prior to visit.    Review of Systems;  Patient denies headache, fevers, malaise, unintentional weight loss, skin rash, eye pain, sinus congestion and sinus pain, sore throat, dysphagia,  hemoptysis , cough, dyspnea, wheezing, chest pain, palpitations, orthopnea, edema, abdominal pain, nausea, melena, diarrhea, constipation, flank pain, dysuria, hematuria, urinary  Frequency, nocturia, numbness, tingling, seizures,  Focal weakness, Loss of consciousness,  Tremor, insomnia, depression, anxiety, and suicidal ideation.      Objective:  BP 120/74   Pulse 66   Temp 98 F (36.7 C)   Resp 20   Ht 5' 4 (1.626 m)   Wt 177 lb 4 oz (80.4 kg)   SpO2 97%   BMI 30.42 kg/m   BP Readings from Last 3 Encounters:  02/29/24 120/74  08/30/23 (!) 140/82  09/21/22 122/68    Wt Readings from Last 3 Encounters:  02/29/24 177 lb 4 oz (80.4 kg)  08/30/23 178 lb (80.7 kg)  09/21/22 179 lb 6.4 oz (81.4 kg)    Physical Exam Vitals reviewed.  Constitutional:      General: She is not in acute distress.    Appearance: Normal appearance. She is normal weight. She  is not ill-appearing, toxic-appearing or diaphoretic.  HENT:     Head: Normocephalic.  Eyes:     General: No scleral icterus.       Right eye: No discharge.        Left eye: No discharge.     Conjunctiva/sclera: Conjunctivae normal.  Cardiovascular:     Rate and Rhythm: Normal rate and regular rhythm.     Heart sounds: Normal heart sounds.  Pulmonary:     Effort: Pulmonary effort is normal. No respiratory distress.     Breath sounds: Normal breath sounds.  Musculoskeletal:        General: Normal range of motion.  Skin:    General: Skin is warm and  dry.     Findings: Lesion present.         Comments: Black papular lesion    Neurological:     General: No focal deficit present.     Mental Status: She is alert and oriented to person, place, and time. Mental status is at baseline.  Psychiatric:        Mood and Affect: Mood normal.        Behavior: Behavior normal.        Thought Content: Thought content normal.        Judgment: Judgment normal.     Lab Results  Component Value Date   HGBA1C 6.5 02/29/2024   HGBA1C 6.5 08/30/2023   HGBA1C 6.1 02/27/2023    Lab Results  Component Value Date   CREATININE 0.87 02/29/2024   CREATININE 0.76 08/30/2023   CREATININE 0.86 02/27/2023    Lab Results  Component Value Date   WBC 8.3 08/30/2023   HGB 13.7 08/30/2023   HCT 41.3 08/30/2023   PLT 220.0 08/30/2023   GLUCOSE 115 (H) 02/29/2024   CHOL 206 (H) 02/29/2024   TRIG 134.0 02/29/2024   HDL 79.60 02/29/2024   LDLDIRECT 98.0 02/29/2024   LDLCALC 99 02/29/2024   ALT 17 02/29/2024   AST 22 02/29/2024   NA 140 02/29/2024   K 3.8 02/29/2024   CL 102 02/29/2024   CREATININE 0.87 02/29/2024   BUN 18 02/29/2024   CO2 31 02/29/2024   TSH 1.29 10/31/2023   HGBA1C 6.5 02/29/2024   MICROALBUR <0.7 02/29/2024    No results found.  Assessment & Plan:  .Essential hypertension Assessment & Plan: Well controlled on current regimen of losartan /hct . Renal function stable, no changes today.  Lab Results  Component Value Date   CREATININE 0.87 02/29/2024   Lab Results  Component Value Date   NA 140 02/29/2024   K 3.8 02/29/2024   CL 102 02/29/2024   CO2 31 02/29/2024     Orders: -     Microalbumin / creatinine urine ratio -     Comprehensive metabolic panel with GFR -     Comprehensive metabolic panel with GFR; Future -     Microalbumin / creatinine urine ratio; Future  Diabetes mellitus type 2, diet-controlled (HCC) Assessment & Plan: Her diabetes remains controlled with diet alone.   eye exam due,  foot exam up  to date.  Vaccines up to date .  She is taking an ARB and a  statin .  Lab Results  Component Value Date   HGBA1C 6.5 02/29/2024   Lab Results  Component Value Date   LABMICR 10.3 06/05/2018   MICROALBUR <0.7 02/29/2024       Orders: -     Microalbumin / creatinine urine ratio -  Hemoglobin A1c -     Comprehensive metabolic panel with GFR -     HM Diabetes Foot Exam -     Comprehensive metabolic panel with GFR; Future -     Hemoglobin A1c; Future  Hyperlipidemia LDL goal <100 Assessment & Plan:  managed with atorvastatin ,  She  has no side effects and liver enzymes are normal.   Lab Results  Component Value Date   CHOL 206 (H) 02/29/2024   HDL 79.60 02/29/2024   LDLCALC 99 02/29/2024   LDLDIRECT 98.0 02/29/2024   TRIG 134.0 02/29/2024   CHOLHDL 3 02/29/2024   Lab Results  Component Value Date   ALT 17 02/29/2024   AST 22 02/29/2024   ALKPHOS 40 02/29/2024   BILITOT 0.7 02/29/2024        Orders: -     Lipid panel -     LDL cholesterol, direct -     Lipid panel; Future  Colon cancer screening  Family history of colon cancer -     Ambulatory referral to Gastroenterology  Encounter for screening mammogram for breast cancer -     3D Screening Mammogram, Left and Right; Future  Skin neoplasm -     Ambulatory referral to Dermatology  Acquired hypothyroidism -     TSH; Future  B12 deficiency -     Vitamin B12; Future  Elevated liver enzymes Assessment & Plan: Repeat LFts are nomal.    Lab Results  Component Value Date   ALT 17 02/29/2024   AST 22 02/29/2024   ALKPHOS 40 02/29/2024   BILITOT 0.7 02/29/2024      Other orders -     Losartan  Potassium-HCTZ; Take 1 tablet by mouth daily.  Dispense: 90 tablet; Refill: 1     I spent 34 minutes on the day of this face to face encounter reviewing patient's  most recent visit with cardiology,  nephrology,  and neurology,  prior relevant surgical and non surgical procedures, recent  labs and  imaging studies, counseling on weight management,  reviewing the assessment and plan with patient, and post visit ordering and reviewing of  diagnostics and therapeutics with patient  .   Follow-up: Return in about 6 months (around 08/31/2024) for physical.   Verneita LITTIE Kettering, MD

## 2024-03-02 NOTE — Assessment & Plan Note (Signed)
 Repeat LFts are nomal.    Lab Results  Component Value Date   ALT 17 02/29/2024   AST 22 02/29/2024   ALKPHOS 40 02/29/2024   BILITOT 0.7 02/29/2024

## 2024-03-02 NOTE — Assessment & Plan Note (Signed)
 Well controlled on current regimen of losartan /hct . Renal function stable, no changes today.  Lab Results  Component Value Date   CREATININE 0.87 02/29/2024   Lab Results  Component Value Date   NA 140 02/29/2024   K 3.8 02/29/2024   CL 102 02/29/2024   CO2 31 02/29/2024

## 2024-03-02 NOTE — Assessment & Plan Note (Signed)
 managed with atorvastatin ,  She  has no side effects and liver enzymes are normal.   Lab Results  Component Value Date   CHOL 206 (H) 02/29/2024   HDL 79.60 02/29/2024   LDLCALC 99 02/29/2024   LDLDIRECT 98.0 02/29/2024   TRIG 134.0 02/29/2024   CHOLHDL 3 02/29/2024   Lab Results  Component Value Date   ALT 17 02/29/2024   AST 22 02/29/2024   ALKPHOS 40 02/29/2024   BILITOT 0.7 02/29/2024

## 2024-03-02 NOTE — Assessment & Plan Note (Addendum)
 Her diabetes remains controlled with diet alone.   eye exam due,  foot exam up to date.  Vaccines up to date .  She is taking an ARB and a  statin .  Lab Results  Component Value Date   HGBA1C 6.5 02/29/2024   Lab Results  Component Value Date   LABMICR 10.3 06/05/2018   MICROALBUR <0.7 02/29/2024

## 2024-03-03 ENCOUNTER — Ambulatory Visit: Payer: Self-pay | Admitting: Internal Medicine

## 2024-05-26 ENCOUNTER — Encounter: Payer: Self-pay | Admitting: Internal Medicine

## 2024-05-26 DIAGNOSIS — D492 Neoplasm of unspecified behavior of bone, soft tissue, and skin: Secondary | ICD-10-CM

## 2024-06-04 NOTE — Telephone Encounter (Signed)
 Noted

## 2024-06-05 NOTE — Telephone Encounter (Signed)
 Noted

## 2024-06-07 ENCOUNTER — Ambulatory Visit: Payer: Self-pay

## 2024-06-07 NOTE — Telephone Encounter (Signed)
 FYI Only or Action Required?: FYI only for provider.  Patient was last seen in primary care on 02/29/2024 by Marylynn Verneita CROME, MD.  Called Nurse Triage reporting Tick Removal.  Symptoms began today.  Interventions attempted: Other: stinger removed.  Symptoms are: unchanged.  Triage Disposition: See PCP When Office is Open (Within 3 Days)  Patient/caregiver understands and will follow disposition?: Yes         Copied from CRM #[id number]. Topic: Clinical - Red Word Triage >> Jun 07, 2024 11:08 AM Amanda Everett wrote: Red Word that prompted transfer to Nurse Triage: tick bites on forearm Reason for Disposition  Bite starts to look bad (e.g., blister, purplish skin, ulcer)  (Exception: There is just minor swelling or small red bump.)    Pt has unknown insect bite, but thinks may be a tick d/t having purple ring like  bruise on RFA. Triager offered virtual UC appt, but today is pt birthday and is unsure if she will make it in time for current appt times. Pt reports that she will go to [facility] as walk-in at her convenience.  Answer Assessment - Initial Assessment Questions 1. TYPE of INSECT: What type of insect was it?      Thinks tick, but unsure. Recent cutting grass. Endorsed taking a stinger out 2. ONSET: When did you get bitten?      Noticed today. 3. LOCATION: Where is the insect bite located?      R forearm 4. REDNESS: Is the area red or pink? If Yes, ask: What size is the area of redness? (inches or cm). When did the redness start?     Endorses purple-bruise looking with a circle. 5. PAIN: Is there any pain? If Yes, ask: How bad is the pain? (Scale 0-10; or none, mild, moderate, severe)     Denies. 6. ITCHING: Does it itch? If Yes, ask: How bad is the itch?      denies 7. SWELLING: How big is the swelling? (e.g., inches, cm, or compare to coins)     Slightly raised 8. OTHER SYMPTOMS: Do you have any other symptoms?  (e.g., difficulty breathing, fever,  hives)     denies 9. PREGNANCY: Is there any chance you are pregnant? When was your last menstrual period?     N/a  Protocols used: Insect Bite-A-AH

## 2024-06-07 NOTE — Telephone Encounter (Signed)
 Lvm for pt to return cal office back. Wanted to confirm pt is going to be checked out. Per triage note stated [facility] was [facility]. As of now there are no notes from urgent care, sending to pcp as FYI

## 2024-06-08 ENCOUNTER — Ambulatory Visit
Admission: EM | Admit: 2024-06-08 | Discharge: 2024-06-08 | Disposition: A | Discharge status: Home / Self Care | Attending: Emergency Medicine | Admitting: Emergency Medicine

## 2024-06-08 DIAGNOSIS — R21 Rash and other nonspecific skin eruption: Secondary | ICD-10-CM | POA: Diagnosis not present

## 2024-06-08 DIAGNOSIS — S50861A Insect bite (nonvenomous) of right forearm, initial encounter: Secondary | ICD-10-CM

## 2024-06-08 DIAGNOSIS — W57XXXA Bitten or stung by nonvenomous insect and other nonvenomous arthropods, initial encounter: Secondary | ICD-10-CM | POA: Diagnosis not present

## 2024-06-08 MED ORDER — MUPIROCIN 2 % EX OINT: 1.0000 | TOPICAL_OINTMENT | Freq: Two times a day (BID) | CUTANEOUS | 0 refills | Status: DC

## 2024-06-08 NOTE — ED Provider Notes (Signed)
 [facility]    CSN: [id number] Arrival date & time: 06/08/24  1346      History   Chief Complaint Chief Complaint  Patient presents with   Insect Bite    HPI Amanda Everett is a 73 y.o. female.  Patient presents with 2-day history of an insect bite on her right forearm.  The area around the wound is bruised.  She was working in the yard when she felt a sting.  She did not see the insect.  Her husband removed a white stinger with tweezers.  She has been treating the area by cleaning it.  No fever, redness, pus, numbness, weakness, rash.  The history is provided by the patient and medical records.    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/18/2022   Acquired hypothyroidism 11/16/2021   Foot pain, right 06/03/2018   Family history of colon cancer 03/14/2017   History of colon polyps 03/14/2017   Overweight (BMI 25.0-29.9) 09/18/2016   Essential hypertension 09/06/2015   Encounter for preventive health examination 07/05/2014   Back pain without sciatica 07/03/2014   Varicose veins of both lower extremities 07/03/2014   Lichen planus 03/26/2014   H/O multiple pulmonary nodules 03/04/2013   Diabetes mellitus type 2, diet-controlled (HCC) 03/04/2013   History of tobacco abuse 03/04/2013   Hyperlipidemia LDL goal <100 03/04/2013    Past Surgical History:  Procedure Laterality Date   COLONOSCOPY  2013   COLONOSCOPY WITH PROPOFOL  N/A 04/12/2017   Procedure: COLONOSCOPY WITH PROPOFOL ;  Surgeon: Dessa Reyes ORN, MD;  Location: [facility] ENDOSCOPY;  Service: Endoscopy;   Laterality: N/A;   TUBAL LIGATION      OB History   No obstetric history on file.      Home Medications    Prior to Admission medications   Medication Sig Start Date End Date Taking? Authorizing Provider  mupirocin ointment (BACTROBAN) 2 % Apply 1 Application topically 2 (two) times daily. 06/08/24  Yes Corlis Burnard DEL, NP  aspirin EC 81 MG tablet Take 81 mg by mouth daily.    [provider]  atorvastatin  (LIPITOR) 20 MG tablet TAKE 1 TABLET BY MOUTH DAILY 08/03/23   Tullo, Teresa L, MD  Cholecalciferol (VITAMIN D3) 3000 units TABS Take 1,000 Units by mouth daily.     [provider]  levothyroxine  (SYNTHROID ) 75 MCG tablet TAKE 1 TABLET BY MOUTH DAILY 01/09/24   Tullo, Teresa L, MD  losartan -hydrochlorothiazide (HYZAAR) 50-12.5 MG tablet Take 1 tablet by mouth daily. 02/29/24   Marylynn Verneita CROME, MD  Naproxen Sod-diphenhydrAMINE (ALEVE PM) 220-25 MG TABS Take 1 tablet by mouth at bedtime.    [provider]  nystatin  (MYCOSTATIN /NYSTOP ) powder Apply 1 Application topically 2 (two) times daily. To rash until resolved. 02/27/23   Tullo, Teresa L, MD  Probiotic Product (PROBIOTIC DAILY) CAPS Take 1 capsule by mouth daily.    [provider]  sertraline  (ZOLOFT ) 50 MG tablet TAKE 1 TABLET BY MOUTH DAILY 08/28/23   Marylynn,  Verneita CROME, MD    Family History Family History  Problem Relation Age of Onset   Stroke Mother        During surgery   Cancer Mother 81       hodgkins   Alcohol abuse Father    Cancer Father 77       colon cancer age 21 and 23 prostate cancer   Arthritis Maternal Grandmother    Heart disease Maternal Grandmother     Social History Social History   Tobacco Use   Smoking status: Former    Current packs/day: 0.00    Average packs/day: 0.5 packs/day for 35.0 years (17.5 ttl pk-yrs)    Types: Cigarettes    Start date: 05/03/1981    Quit date: 05/03/2016    Years since quitting: 8.1   Smokeless tobacco: Never  Substance Use Topics    Alcohol use: Yes    Alcohol/week: 1.0 standard drink of alcohol    Types: 1 Glasses of wine per week    Comment: rarely   Drug use: No     Allergies   Ivp dye [iodinated contrast media] and Tetracyclines & related   Review of Systems Review of Systems  Constitutional:  Negative for chills and fever.  Musculoskeletal:  Negative for arthralgias and joint swelling.  Skin:  Positive for color change and wound.  Neurological:  Negative for weakness and numbness.     Physical Exam Triage Vital Signs ED Triage Vitals  Encounter Vitals Group     BP 06/08/24 1502 135/89     Girls Systolic BP Percentile --      Girls Diastolic BP Percentile --      Boys Systolic BP Percentile --      Boys Diastolic BP Percentile --      Pulse Rate 06/08/24 1502 64     Resp 06/08/24 1502 19     Temp 06/08/24 1502 97.9 F (36.6 C)     Temp src --      SpO2 06/08/24 1502 98 %     Weight --      Height --      Head Circumference --      Peak Flow --      Pain Score 06/08/24 1459 0     Pain Loc --      Pain Education --      Exclude from Growth Chart --    No data found.  Updated Vital Signs BP 135/89   Pulse 64   Temp 97.9 F (36.6 C)   Resp 19   SpO2 98%   Visual Acuity Right Eye Distance:   Left Eye Distance:   Bilateral Distance:    Right Eye Near:   Left Eye Near:    Bilateral Near:     Physical Exam Constitutional:      General: She is not in acute distress. HENT:     Mouth/Throat:     Mouth: Mucous membranes are moist.  Cardiovascular:     Rate and Rhythm: Normal rate.  Pulmonary:     Effort: Pulmonary effort is normal. No respiratory distress.  Musculoskeletal:        General: No swelling or deformity. Normal range of motion.  Skin:    General: Skin is warm and dry.     Capillary Refill: Capillary refill takes less than 2 seconds.     Findings: Bruising and lesion present.     Comments: Superficial wound on right forearm with localized ecchymosis.  No erythema  or drainage.  No induration.  Neurological:     General: No focal deficit present.     Mental Status: She is alert.     Sensory: No sensory deficit.     Motor: No weakness.      UC Treatments / Results  Labs (all labs ordered are listed, but only abnormal results are displayed) Labs Reviewed - No data to display  EKG   Radiology No results found.  Procedures Procedures (including critical care time)  Medications Ordered in UC Medications - No data to display  Initial Impression / Assessment and Plan / UC Course  I have reviewed the triage vital signs and the nursing notes.  Pertinent labs & imaging results that were available during my care of the patient were reviewed by me and considered in my medical decision making (see chart for details).    Rash due to insect bite on right forearm.  Afebrile and vital signs are stable.  The wound does not appear infected.  Instructed patient to continue wound care with soap and water.  Treating with mupirocin ointment.  Instructed her to follow-up with her PCP if her symptoms are not improving or get worse.  Education provided on insect bites.  She agrees to plan of care..  Final Clinical Impressions(s) / UC Diagnoses   Final diagnoses:  Rash and nonspecific skin eruption  Insect bite of right forearm, initial encounter     Discharge Instructions      Keep your wound clean and dry.  Wash it gently twice a day with soap and water.  Then apply the mupirocin ointment twice a day.  Follow-up with your primary care provider if your symptoms persist or get worse.     ED Prescriptions     Medication Sig Dispense Auth. Provider   mupirocin ointment (BACTROBAN) 2 % Apply 1 Application topically 2 (two) times daily. 22 g Corlis Burnard DEL, NP      PDMP not reviewed this encounter.   Corlis Burnard DEL, NP 06/08/24 [phone]

## 2024-06-08 NOTE — Discharge Instructions (Addendum)
 Keep your wound clean and dry.  Wash it gently twice a day with soap and water.  Then apply the mupirocin ointment twice a day.  Follow-up with your primary care provider if your symptoms persist or get worse.

## 2024-06-08 NOTE — ED Triage Notes (Signed)
 Patient to Urgent Care with complaints of an insect bite present to her right forearm. Two puncture wounds.   Symptoms x2 days. Area increasing in size. Bruise surrounding the area.

## 2024-06-17 DIAGNOSIS — Z23 Encounter for immunization: Secondary | ICD-10-CM | POA: Diagnosis not present

## 2024-07-01 ENCOUNTER — Other Ambulatory Visit: Payer: Self-pay | Admitting: Internal Medicine

## 2024-07-04 ENCOUNTER — Telehealth: Payer: Self-pay | Admitting: Gastroenterology

## 2024-07-04 NOTE — Telephone Encounter (Signed)
 Good morning Dr. San,   Patient is wishing to transfer her care specifically over to you for her next colonoscopy. Patient last had a colonoscopy in 2018 with [facility]. Patient is wishing to transfer her care due to her Husband being a current patient of yours and has been happy with care provided. Patient's previous colonoscopy report is in  Epic under procedure tab for you to review and advise on scheduling.   Thank you

## 2024-07-10 ENCOUNTER — Encounter: Payer: Self-pay | Admitting: Gastroenterology

## 2024-07-10 NOTE — Telephone Encounter (Signed)
 Left patient voicemail to call and schedule.

## 2024-08-01 ENCOUNTER — Other Ambulatory Visit: Payer: Self-pay | Admitting: Internal Medicine

## 2024-08-13 ENCOUNTER — Ambulatory Visit

## 2024-08-13 VITALS — Ht 64.0 in | Wt 176.6 lb

## 2024-08-13 DIAGNOSIS — Z800 Family history of malignant neoplasm of digestive organs: Secondary | ICD-10-CM

## 2024-08-13 MED ORDER — PEG 3350-KCL-NA BICARB-NACL 420 G PO SOLR: 4000.0000 mL | Freq: Once | ORAL | 0 refills | Status: AC

## 2024-08-13 NOTE — Progress Notes (Signed)
 PCP MD at time of PV: Verneita Kettering, MD  __________________________________________________________________________________________________________________________________________  No egg allergy known to patient  No soy allergy known to patient No issues known to pt with past sedation with any surgeries or procedures Patient denies ever being told they had issues or difficulty with intubation  No FH of Malignant Hyperthermia Pt is not on diet pills Pt is not on  home 02  Pt is not on blood thinners  No A fib or A flutter Have any cardiac testing pending--no  LOA: independent  No Chew or Snuff tobacco __________________________________________________________________________________________________________________________________________  Constipation: no  Prep: Golytely   __________________________________________________________________________________________________________________________________________  PV completed with patient. Prep instructions reviewed and provided during apt. Rx sent to preferred pharmacy.  __________________________________________________________________________________________________________________________________________  Patient's chart reviewed by Norleen Schillings CNRA prior to previsit and patient appropriate for the [facility].  Previsit completed and red dot placed by patient's name on their procedure day (on provider's schedule).

## 2024-08-16 ENCOUNTER — Other Ambulatory Visit: Payer: Self-pay | Admitting: Internal Medicine

## 2024-08-28 ENCOUNTER — Ambulatory Visit: Admitting: Gastroenterology

## 2024-08-28 ENCOUNTER — Encounter: Payer: Self-pay | Admitting: Gastroenterology

## 2024-08-28 VITALS — BP 148/89 | HR 59 | Temp 97.3°F | Resp 18 | Ht 64.0 in | Wt 174.0 lb

## 2024-08-28 DIAGNOSIS — K648 Other hemorrhoids: Secondary | ICD-10-CM | POA: Diagnosis not present

## 2024-08-28 DIAGNOSIS — D122 Benign neoplasm of ascending colon: Secondary | ICD-10-CM

## 2024-08-28 DIAGNOSIS — K640 First degree hemorrhoids: Secondary | ICD-10-CM

## 2024-08-28 DIAGNOSIS — K5730 Diverticulosis of large intestine without perforation or abscess without bleeding: Secondary | ICD-10-CM | POA: Diagnosis not present

## 2024-08-28 DIAGNOSIS — Z800 Family history of malignant neoplasm of digestive organs: Secondary | ICD-10-CM | POA: Diagnosis not present

## 2024-08-28 DIAGNOSIS — Z1211 Encounter for screening for malignant neoplasm of colon: Secondary | ICD-10-CM | POA: Diagnosis not present

## 2024-08-28 MED ORDER — SODIUM CHLORIDE 0.9 % IV SOLN: 500.0000 mL | Freq: Once | INTRAVENOUS | Status: DC

## 2024-08-28 NOTE — Progress Notes (Signed)
 Vss nad trans to pacu

## 2024-08-28 NOTE — Op Note (Signed)
 [facility] Patient Name: Amanda Everett Procedure Date: 08/28/2024 11:22 AM MRN: [id number] Endoscopist: Sandor Flatter , MD, [id number] Age: 74 Referring MD:  Date of Birth: 1951/06/29 Gender: Female Account #: [id number] Procedure:                Colonoscopy Indications:              Screening for colon cancer: Family history of                            colorectal cancer in multiple 2nd degree relatives,                            Screening in patient at increased risk: Colorectal                            cancer in father before age 77                           Family history notable for colon cancer; father                            diagnosed age 89, a few paternal uncles with colon                            cancer, at least 1 maternal aunt with colon cancer.                            Patient is otherwise without complaints or active                            issues today.                           Last colonoscopy was 03/2017 at [facility] and notable                            for sigmoid diverticulosis and otherwise normal                            with recommendation to repeat in 5 years due to                            family history of colon cancer. Medicines:                Monitored Anesthesia Care Procedure:                Pre-Anesthesia Assessment:                           - Prior to the procedure, a History and Physical                            was performed, and patient medications and  allergies were reviewed. The patient's tolerance of                            previous anesthesia was also reviewed. The risks                            and benefits of the procedure and the sedation                            options and risks were discussed with the patient.                            All questions were answered, and informed consent                            was obtained. Prior Anticoagulants: The patient has                             taken no anticoagulant or antiplatelet agents. ASA                            Grade Assessment: III - A patient with severe                            systemic disease. After reviewing the risks and                            benefits, the patient was deemed in satisfactory                            condition to undergo the procedure.                           After obtaining informed consent, the colonoscope                            was passed under direct vision. Throughout the                            procedure, the patient's blood pressure, pulse, and                            oxygen saturations were monitored continuously. The                            Olympus Scope SN [id number] was introduced through the                            anus and advanced to the the cecum, identified by                            appendiceal orifice and ileocecal valve. The  colonoscopy was performed without difficulty. The                            patient tolerated the procedure well. The quality                            of the bowel preparation was good. The ileocecal                            valve, appendiceal orifice, and rectum were                            photographed. Scope In: 11:29:23 AM Scope Out: 11:44:51 AM Scope Withdrawal Time: 0 hours 11 minutes 1 second  Total Procedure Duration: 0 hours 15 minutes 28 seconds  Findings:                 The perianal and digital rectal examinations were                            normal.                           A 3 mm polyp was found in the ascending colon. The                            polyp was sessile. The polyp was removed with a                            cold snare. Resection and retrieval were complete.                            Estimated blood loss was minimal.                           Multiple small-mouthed diverticula were found in                            the sigmoid colon, descending  colon, transverse                            colon and ascending colon.                           Non-bleeding internal hemorrhoids were found during                            retroflexion. The hemorrhoids were small. Complications:            No immediate complications. Estimated Blood Loss:     Estimated blood loss was minimal. Impression:               - One 3 mm polyp in the ascending colon, removed                            with a cold snare. Resected  and retrieved.                           - Diverticulosis in the sigmoid colon, in the                            descending colon, in the transverse colon and in                            the ascending colon.                           - Non-bleeding internal hemorrhoids. Recommendation:           - Patient has a contact number available for                            emergencies. The signs and symptoms of potential                            delayed complications were discussed with the                            patient. Return to normal activities tomorrow.                            Written discharge instructions were provided to the                            patient.                           - Resume previous diet.                           - Continue present medications.                           - Await pathology results.                           - Repeat colonoscopy in 5 years for surveillance                            due to family history.                           - Return to GI clinic PRN. Sandor Flatter, MD 08/28/2024 11:49:27 AM

## 2024-08-28 NOTE — Progress Notes (Signed)
 "   GASTROENTEROLOGY PROCEDURE H&P NOTE   Primary Care Physician: Marylynn Verneita CROME, MD    Reason for Procedure:  Colon Cancer screening, family history of colon cancer  Plan:    Colonoscopy  Patient is appropriate for endoscopic procedure(s) in the ambulatory ([facility]) setting.  The nature of the procedure, as well as the risks, benefits, and alternatives were carefully and thoroughly reviewed with the patient. Ample time for discussion and questions allowed. The patient understood, was satisfied, and agreed to proceed. I personally addressed all patient questions and concerns.     HPI: Amanda Everett is a 74 y.o. female who presents for colonoscopy for ongoing Colon Cancer screening.  Family history notable for colon cancer; father diagnosed age 21, a few paternal uncles with colon cancer, at least 1 maternal aunt with colon cancer.   Patient is otherwise without complaints or active issues today.  Last colonoscopy was 03/2017 at [facility] and notable for sigmoid diverticulosis and otherwise normal with recommendation to repeat in 5 years due to family history of colon cancer.   Past Medical History:  Diagnosis Date   Chicken pox    Colon polyps    Diabetes mellitus without complication (HCC)    Heart murmur    MVP slight   Shingles    35   Thyroid  disease    UTI (urinary tract infection)    HX    Past Surgical History:  Procedure Laterality Date   COLONOSCOPY  2013   COLONOSCOPY WITH PROPOFOL  N/A 04/12/2017   Procedure: COLONOSCOPY WITH PROPOFOL ;  Surgeon: Dessa Reyes ORN, MD;  Location: [facility] ENDOSCOPY;  Service: Endoscopy;  Laterality: N/A;   TUBAL LIGATION      Prior to Admission medications  Medication Sig Start Date End Date Taking? Authorizing Provider  polyethylene glycol-electrolytes (NULYTELY ) 420 g solution Take 4,000 mLs by mouth once. 08/16/24  Yes [provider]  aspirin EC 81 MG tablet Take 81 mg by mouth daily.    [provider]   atorvastatin  (LIPITOR) 20 MG tablet TAKE 1 TABLET BY MOUTH DAILY 08/20/24   Tullo, Teresa L, MD  Cholecalciferol (VITAMIN D3) 3000 units TABS Take 1,000 Units by mouth daily.     [provider]  fluconazole  (DIFLUCAN ) 150 MG tablet TAKE 1 TABLET BY MOUTH ONCE DAILY AS DIRECTED. Patient not taking: Reported on 08/13/2024 07/06/24   Tullo, Teresa L, MD  fluocinonide gel (LIDEX) 0.05 % Apply 1 Application topically 2 (two) times daily as needed.    [provider]  levothyroxine  (SYNTHROID ) 75 MCG tablet TAKE 1 TABLET BY MOUTH DAILY 08/01/24   Tullo, Teresa L, MD  losartan -hydrochlorothiazide (HYZAAR) 50-12.5 MG tablet TAKE 1 TABLET BY MOUTH DAILY 08/20/24   Tullo, Teresa L, MD  mupirocin  ointment (BACTROBAN ) 2 % Apply 1 Application topically 2 (two) times daily. Patient not taking: Reported on 08/13/2024 06/08/24   Corlis Burnard DEL, NP  Naproxen Sod-diphenhydrAMINE (ALEVE PM) 220-25 MG TABS Take 1 tablet by mouth at bedtime.    [provider]  NYSTATIN  powder APPLY TOPICALLY TO RASH TWICE DAILY UNTIL RESOLVED 07/06/24   Tullo, Teresa L, MD  Probiotic Product (PROBIOTIC DAILY) CAPS Take 1 capsule by mouth daily.    [provider]  sertraline  (ZOLOFT ) 50 MG tablet TAKE 1 TABLET BY MOUTH DAILY 08/28/23   Marylynn Verneita CROME, MD    Current Outpatient Medications  Medication Sig Dispense Refill   polyethylene glycol-electrolytes (NULYTELY ) 420 g solution Take 4,000 mLs by mouth once.  aspirin EC 81 MG tablet Take 81 mg by mouth daily.     atorvastatin  (LIPITOR) 20 MG tablet TAKE 1 TABLET BY MOUTH DAILY 90 tablet 3   Cholecalciferol (VITAMIN D3) 3000 units TABS Take 1,000 Units by mouth daily.      fluconazole  (DIFLUCAN ) 150 MG tablet TAKE 1 TABLET BY MOUTH ONCE DAILY AS DIRECTED. (Patient not taking: Reported on 08/13/2024) 2 tablet 0   fluocinonide gel (LIDEX) 0.05 % Apply 1 Application topically 2 (two) times daily as needed.     levothyroxine  (SYNTHROID ) 75 MCG  tablet TAKE 1 TABLET BY MOUTH DAILY 90 tablet 3   losartan -hydrochlorothiazide (HYZAAR) 50-12.5 MG tablet TAKE 1 TABLET BY MOUTH DAILY 90 tablet 3   mupirocin  ointment (BACTROBAN ) 2 % Apply 1 Application topically 2 (two) times daily. (Patient not taking: Reported on 08/13/2024) 22 g 0   Naproxen Sod-diphenhydrAMINE (ALEVE PM) 220-25 MG TABS Take 1 tablet by mouth at bedtime.     NYSTATIN  powder APPLY TOPICALLY TO RASH TWICE DAILY UNTIL RESOLVED 15 g 0   Probiotic Product (PROBIOTIC DAILY) CAPS Take 1 capsule by mouth daily.     sertraline  (ZOLOFT ) 50 MG tablet TAKE 1 TABLET BY MOUTH DAILY 90 tablet 3   No current facility-administered medications for this visit.    Allergies as of 08/28/2024 - Review Complete 08/13/2024  Allergen Reaction Noted   Ivp dye [iodinated contrast media] Anaphylaxis 03/04/2013   Tetracyclines & related Anaphylaxis 03/04/2013    Family History  Problem Relation Age of Onset   Stroke Mother        During surgery   Cancer Mother 34       hodgkins   Colon cancer Father 73 - 45   Alcohol abuse Father    Cancer Father 42       colon cancer age 61 and 25 prostate cancer   Rectal cancer Sister    Colon cancer Maternal Aunt    Colon cancer Maternal Uncle    Arthritis Maternal Grandmother    Heart disease Maternal Grandmother    Stomach cancer Neg Hx     Social History   Socioeconomic History   Marital status: Married    Spouse name: Not on file   Number of children: Not on file   Years of education: Not on file   Highest education level: Not on file  Occupational History   Occupation: retired Engineer, Civil (consulting)  Tobacco Use   Smoking status: Former    Current packs/day: 0.00    Average packs/day: 0.5 packs/day for 35.0 years (17.5 ttl pk-yrs)    Types: Cigarettes    Start date: 05/03/1981    Quit date: 05/03/2016    Years since quitting: 8.3   Smokeless tobacco: Never  Substance and Sexual Activity   Alcohol use: Yes    Alcohol/week: 1.0 standard drink of  alcohol    Types: 1 Glasses of wine per week    Comment: rarely   Drug use: No   Sexual activity: Never  Other Topics Concern   Not on file  Social History Narrative   Not on file   Social Drivers of Health   Tobacco Use: Medium Risk (08/13/2024)   Patient History    Smoking Tobacco Use: Former    Smokeless Tobacco Use: Never    Passive Exposure: Not on file  Financial Resource Strain: Low Risk (08/29/2023)   Overall Financial Resource Strain (CARDIA)    Difficulty of Paying Living Expenses: Not very hard  Food Insecurity:  No Food Insecurity (08/29/2023)   Hunger Vital Sign    Worried About Running Out of Food in the Last Year: Never true    Ran Out of Food in the Last Year: Never true  Transportation Needs: No Transportation Needs (08/29/2023)   PRAPARE - Administrator, Civil Service (Medical): No    Lack of Transportation (Non-Medical): No  Physical Activity: Insufficiently Active (08/29/2023)   Exercise Vital Sign    Days of Exercise per Week: 2 days    Minutes of Exercise per Session: 30 min  Stress: Patient Declined (08/29/2023)   [employer] of Occupational Health - Occupational Stress Questionnaire    Feeling of Stress : Patient declined  Social Connections: Moderately Isolated (08/29/2023)   Social Connection and Isolation Panel    Frequency of Communication with Friends and Family: More than three times a week    Frequency of Social Gatherings with Friends and Family: Three times a week    Attends Religious Services: Never    Active Member of Clubs or Organizations: No    Attends Banker Meetings: Not on file    Marital Status: Married  Catering Manager Violence: Not At Risk (06/29/2022)   Humiliation, Afraid, Rape, and Kick questionnaire    Fear of Current or Ex-Partner: No    Emotionally Abused: No    Physically Abused: No    Sexually Abused: No  Depression (PHQ2-9): Low Risk (02/29/2024)   Depression (PHQ2-9)    PHQ-2 Score: 0  Alcohol  Screen: Not on file  Housing: Low Risk (08/29/2023)   Housing Stability Vital Sign    Unable to Pay for Housing in the Last Year: No    Number of Times Moved in the Last Year: 0    Homeless in the Last Year: No  Utilities: Not At Risk (06/29/2022)   [employer] Utilities    Threatened with loss of utilities: No  Health Literacy: Not on file    Physical Exam: Vital signs in last 24 hours: @There  were no vitals taken for this visit. GEN: NAD EYE: Sclerae anicteric ENT: MMM CV: Non-tachycardic Pulm: CTA b/l GI: Soft, NT/ND NEURO:  Alert & Oriented x 3   Sandor Flatter, DO [facility]   08/28/2024 10:22 AM  "

## 2024-08-28 NOTE — Progress Notes (Signed)
 Pt's states no medical or surgical changes since previsit or office visit.

## 2024-08-28 NOTE — Progress Notes (Signed)
 Called to room to assist during endoscopic procedure.  Patient ID and intended procedure confirmed with present staff. Received instructions for my participation in the procedure from the performing physician.

## 2024-08-28 NOTE — Patient Instructions (Signed)

## 2024-08-29 ENCOUNTER — Telehealth: Payer: Self-pay | Admitting: *Deleted

## 2024-08-29 NOTE — Telephone Encounter (Signed)
 No answer for post procedure call back. Left VM.

## 2024-08-30 LAB — SURGICAL PATHOLOGY

## 2024-09-02 ENCOUNTER — Ambulatory Visit: Payer: Self-pay | Admitting: Gastroenterology

## 2024-09-03 ENCOUNTER — Other Ambulatory Visit

## 2024-09-03 DIAGNOSIS — I10 Essential (primary) hypertension: Secondary | ICD-10-CM | POA: Diagnosis not present

## 2024-09-03 DIAGNOSIS — E119 Type 2 diabetes mellitus without complications: Secondary | ICD-10-CM

## 2024-09-03 DIAGNOSIS — E785 Hyperlipidemia, unspecified: Secondary | ICD-10-CM | POA: Diagnosis not present

## 2024-09-03 DIAGNOSIS — E538 Deficiency of other specified B group vitamins: Secondary | ICD-10-CM | POA: Diagnosis not present

## 2024-09-03 DIAGNOSIS — E039 Hypothyroidism, unspecified: Secondary | ICD-10-CM

## 2024-09-03 LAB — COMPREHENSIVE METABOLIC PANEL WITH GFR
ALT: 17 U/L (ref 3–35)
AST: 19 U/L (ref 5–37)
Albumin: 4 g/dL (ref 3.5–5.2)
Alkaline Phosphatase: 31 U/L — ABNORMAL LOW (ref 39–117)
BUN: 19 mg/dL (ref 6–23)
CO2: 29 meq/L (ref 19–32)
Calcium: 8.7 mg/dL (ref 8.4–10.5)
Chloride: 101 meq/L (ref 96–112)
Creatinine, Ser: 0.81 mg/dL (ref 0.40–1.20)
GFR: 72.1 mL/min
Glucose, Bld: 100 mg/dL — ABNORMAL HIGH (ref 70–99)
Potassium: 4 meq/L (ref 3.5–5.1)
Sodium: 138 meq/L (ref 135–145)
Total Bilirubin: 0.6 mg/dL (ref 0.2–1.2)
Total Protein: 6.6 g/dL (ref 6.0–8.3)

## 2024-09-03 LAB — HEMOGLOBIN A1C: Hgb A1c MFr Bld: 6.4 % (ref 4.6–6.5)

## 2024-09-03 LAB — LIPID PANEL
Cholesterol: 178 mg/dL (ref 28–200)
HDL: 71.7 mg/dL
LDL Cholesterol: 85 mg/dL (ref 10–99)
NonHDL: 106.24
Total CHOL/HDL Ratio: 2
Triglycerides: 105 mg/dL (ref 10.0–149.0)
VLDL: 21 mg/dL (ref 0.0–40.0)

## 2024-09-03 LAB — MICROALBUMIN / CREATININE URINE RATIO
Creatinine,U: 28.6 mg/dL
Microalb Creat Ratio: UNDETERMINED mg/g (ref 0.0–30.0)
Microalb, Ur: <0.7 mg/dL

## 2024-09-03 LAB — TSH: TSH: 3.54 u[IU]/mL (ref 0.35–5.50)

## 2024-09-03 LAB — VITAMIN B12: Vitamin B-12: 323 pg/mL (ref 211–911)

## 2024-09-04 ENCOUNTER — Encounter: Payer: Self-pay | Admitting: Internal Medicine

## 2024-09-04 LAB — HM MAMMOGRAPHY

## 2024-09-05 ENCOUNTER — Encounter: Payer: Self-pay | Admitting: Internal Medicine

## 2024-09-05 ENCOUNTER — Ambulatory Visit: Admitting: Internal Medicine

## 2024-09-05 VITALS — BP 128/78 | HR 68 | Temp 97.8°F | Ht 65.0 in | Wt 174.5 lb

## 2024-09-05 DIAGNOSIS — I10 Essential (primary) hypertension: Secondary | ICD-10-CM | POA: Diagnosis not present

## 2024-09-05 DIAGNOSIS — R748 Abnormal levels of other serum enzymes: Secondary | ICD-10-CM

## 2024-09-05 DIAGNOSIS — E785 Hyperlipidemia, unspecified: Secondary | ICD-10-CM

## 2024-09-05 DIAGNOSIS — B3789 Other sites of candidiasis: Secondary | ICD-10-CM

## 2024-09-05 DIAGNOSIS — E119 Type 2 diabetes mellitus without complications: Secondary | ICD-10-CM

## 2024-09-05 DIAGNOSIS — E039 Hypothyroidism, unspecified: Secondary | ICD-10-CM | POA: Diagnosis not present

## 2024-09-05 MED ORDER — ATORVASTATIN CALCIUM 40 MG PO TABS: 40.0000 mg | ORAL_TABLET | Freq: Every day | ORAL | 1 refills | Status: AC

## 2024-09-05 MED ORDER — NYSTATIN 100000 UNIT/GM EX POWD: Freq: Two times a day (BID) | CUTANEOUS | 0 refills | Status: AC

## 2024-09-05 MED ORDER — LEVOTHYROXINE SODIUM 88 MCG PO TABS: 88.0000 ug | ORAL_TABLET | Freq: Every day | ORAL | 1 refills | Status: AC

## 2024-09-05 NOTE — Progress Notes (Signed)
 Patient ID: Amanda Everett, female    DOB: 08/23/1950  Age: 74 y.o. MRN: [id number]  The patient is here for management of other chronic and acute problems.   The risk factors are reflected in the social history.   The roster of all physicians providing medical care to patient - is listed in the Snapshot section of the chart.   Activities of daily living:  The patient is 100% independent in all ADLs: dressing, toileting, feeding as well as independent mobility   Home safety : The patient has smoke detectors in the home. They wear seatbelts.  There are no unsecured firearms at home. There is no violence in the home.    There is no risks for hepatitis, STDs or HIV. There is no   history of blood transfusion. They have no travel history to infectious disease endemic areas of the world.   The patient has seen their dentist in the last six month. They have seen their eye doctor in the last year. The patinet  denies slight hearing difficulty with regard to whispered voices and some television programs.  They have deferred audiologic testing in the last year.  They do not  have excessive sun exposure. Discussed the need for sun protection: hats, long sleeves and use of sunscreen if there is significant sun exposure.    Diet: the importance of a healthy diet is discussed. They do have a healthy diet.   The benefits of regular aerobic exercise were discussed. The patient  exercises  3 to 5 days per week  for  60 minutes.    Depression screen: there are no signs or vegative symptoms of depression- irritability, change in appetite, anhedonia, sadness/tearfullness.   The following portions of the patient's history were reviewed and updated as appropriate: allergies, current medications, past family history, past medical history,  past surgical history, past social history  and problem list.   Visual acuity was not assessed per patient preference since the patient has regular follow up with an   ophthalmologist. Hearing and body mass index were assessed and reviewed.    During the course of the visit the patient was educated and counseled about appropriate screening and preventive services including : fall prevention , diabetes screening, nutrition counseling, colorectal cancer screening, and recommended immunizations.    Chief Complaint:   T2DM:  She  feels generally well,  But is not  exercising regularly or trying to lose weight. Checking  blood sugars less than once daily at variable times, usually only if she feels she may be having a hypoglycemic event. .  BS have been under 130 fasting and < 150 post prandially.  Denies any recent hypoglyemic events.  Taking   medications as directed. Following a carbohydrate modified diet 6 days per week. Denies numbness, burning and tingling of extremities. Appetite is good.       Review of Symptoms  Patient denies headache, fevers, malaise, unintentional weight loss, skin rash, eye pain, sinus congestion and sinus pain, sore throat, dysphagia,  hemoptysis , cough, dyspnea, wheezing, chest pain, palpitations, orthopnea, edema, abdominal pain, nausea, melena, diarrhea, constipation, flank pain, dysuria, hematuria, urinary  Frequency, nocturia, numbness, tingling, seizures,  Focal weakness, Loss of consciousness,  Tremor, insomnia, depression, anxiety, and suicidal ideation.    Physical Exam:  BP 128/78 (Cuff Size: Normal)   Pulse 68   Temp 97.8 F (36.6 C) (Oral)   Ht 5' 5 (1.651 m)   Wt 174 lb 8 oz (79.2 kg)  SpO2 98%   BMI 29.04 kg/m    Physical Exam Vitals reviewed.  Constitutional:      General: She is not in acute distress.    Appearance: Normal appearance. She is normal weight. She is not ill-appearing, toxic-appearing or diaphoretic.  HENT:     Head: Normocephalic.  Eyes:     General: No scleral icterus.       Right eye: No discharge.        Left eye: No discharge.     Conjunctiva/sclera: Conjunctivae normal.   Cardiovascular:     Rate and Rhythm: Normal rate and regular rhythm.     Heart sounds: Normal heart sounds.  Pulmonary:     Effort: Pulmonary effort is normal. No respiratory distress.     Breath sounds: Normal breath sounds.  Musculoskeletal:        General: Normal range of motion.  Skin:    General: Skin is warm and dry.  Neurological:     General: No focal deficit present.     Mental Status: She is alert and oriented to person, place, and time. Mental status is at baseline.  Psychiatric:        Mood and Affect: Mood normal.        Behavior: Behavior normal.        Thought Content: Thought content normal.        Judgment: Judgment normal.     Assessment and Plan: Diabetes mellitus type 2, diet-controlled (HCC) Assessment & Plan: Her diabetes remains controlled with diet alone.   eye exam due,  foot exam up to date.  Vaccines up to date .  She is taking an ARB and a  statin .  Lab Results  Component Value Date   HGBA1C 6.4 09/03/2024   Lab Results  Component Value Date   LABMICR 10.3 06/05/2018   MICROALBUR <0.7 09/03/2024   MICROALBUR <0.7 02/29/2024       Orders: -     Hemoglobin A1c; Future  Hyperlipidemia LDL goal <100 Assessment & Plan:  managed with atorvastatin ,  She  has no side effects and liver enzymes are normal. Will increase dose at next check if LDL is not at goal   Lab Results  Component Value Date   CHOL 178 09/03/2024   HDL 71.70 09/03/2024   LDLCALC 85 09/03/2024   LDLDIRECT 98.0 02/29/2024   TRIG 105.0 09/03/2024   CHOLHDL 2 09/03/2024   Lab Results  Component Value Date   ALT 17 09/03/2024   AST 19 09/03/2024   ALKPHOS 31 (L) 09/03/2024   BILITOT 0.6 09/03/2024         Acquired hypothyroidism Assessment & Plan: Thyroid  function is  low end of normal on current dose of 75 mcg. Advised to increase dose to 88 mcg daily    Lab Results  Component Value Date   TSH 3.54 09/03/2024     Orders: -     TSH; Future  Elevated  liver enzymes Assessment & Plan: Repeat LFts are nomal.    Lab Results  Component Value Date   ALT 17 09/03/2024   AST 19 09/03/2024   ALKPHOS 31 (L) 09/03/2024   BILITOT 0.6 09/03/2024      Essential hypertension Assessment & Plan: Well controlled on current regimen of losartan /hct . Renal function stable, no changes today.  Lab Results  Component Value Date   CREATININE 0.81 09/03/2024   Lab Results  Component Value Date   NA 138 09/03/2024   K  4.0 09/03/2024   CL 101 09/03/2024   CO2 29 09/03/2024      Hyperlipidemia LDL goal <70 Assessment & Plan:  managed with atorvastatin ,  She  has no side effects and liver enzymes are normal. Will increase dose at next check if LDL is not at goal   Lab Results  Component Value Date   CHOL 178 09/03/2024   HDL 71.70 09/03/2024   LDLCALC 85 09/03/2024   LDLDIRECT 98.0 02/29/2024   TRIG 105.0 09/03/2024   CHOLHDL 2 09/03/2024   Lab Results  Component Value Date   ALT 17 09/03/2024   AST 19 09/03/2024   ALKPHOS 31 (L) 09/03/2024   BILITOT 0.6 09/03/2024        Orders: -     Atorvastatin  Calcium ; Take 1 tablet (40 mg total) by mouth daily.  Dispense: 90 tablet; Refill: 1 -     Lipid panel; Future -     Comprehensive metabolic panel with GFR; Future  Candida rash of groin Assessment & Plan: Occurring in skin folds.  Refilling nystatin  powder    Other orders -     Levothyroxine  Sodium; Take 1 tablet (88 mcg total) by mouth daily.  Dispense: 90 tablet; Refill: 1 -     Nystatin ; Apply topically 2 (two) times daily.  Dispense: 60 g; Refill: 0    Return in about 6 months (around 03/05/2025).  Verneita LITTIE Kettering, MD

## 2024-09-05 NOTE — Patient Instructions (Addendum)
 AVOID MEDICATIONS CONTAINING DIPHENHYDRAMINE  AND  DOXYLAMINE BECAUSE THEY HAVE POTENTIAL SIDE EFFECTS   USE PLAIN ALEVE AND TYLENOL COMBINED FOR JOINT PAIN and try the following natural remedy:    Relaxium for insomnia  (as seen on TV commercials) . It is available through [employer] and contains all natural supplements:  Melatonin 5 mg  Chamomile 25 mg Passionflower extract 75 mg GABA 100 mg Ashwaganda extract 125 mg Magnesium citrate, glycinate, oxide (100 mg)  L tryptophan 500 mg Valerest (proprietary  ingredient ; probably valeria root extract)     I have increased the atorvastatin  and the levothyroxine .  We should repeat labs in 3 months,  but you can schedule JUST LABS,    RETURN TO SEE ME IN [place]

## 2024-09-06 ENCOUNTER — Other Ambulatory Visit: Payer: Self-pay | Admitting: Internal Medicine

## 2024-09-07 NOTE — Assessment & Plan Note (Signed)
 Well controlled on current regimen of losartan /hct . Renal function stable, no changes today.  Lab Results  Component Value Date   CREATININE 0.81 09/03/2024   Lab Results  Component Value Date   NA 138 09/03/2024   K 4.0 09/03/2024   CL 101 09/03/2024   CO2 29 09/03/2024

## 2024-09-07 NOTE — Assessment & Plan Note (Signed)
 Occurring in skin folds.  Refilling nystatin  powder

## 2024-09-07 NOTE — Assessment & Plan Note (Signed)
 Her diabetes remains controlled with diet alone.   eye exam due,  foot exam up to date.  Vaccines up to date .  She is taking an ARB and a  statin .  Lab Results  Component Value Date   HGBA1C 6.4 09/03/2024   Lab Results  Component Value Date   LABMICR 10.3 06/05/2018   MICROALBUR <0.7 09/03/2024   MICROALBUR <0.7 02/29/2024

## 2024-09-07 NOTE — Assessment & Plan Note (Addendum)
"   managed with atorvastatin ,  She  has no side effects and liver enzymes are normal. Will increase dose at next check if LDL is not at goal   Lab Results  Component Value Date   CHOL 178 09/03/2024   HDL 71.70 09/03/2024   LDLCALC 85 09/03/2024   LDLDIRECT 98.0 02/29/2024   TRIG 105.0 09/03/2024   CHOLHDL 2 09/03/2024   Lab Results  Component Value Date   ALT 17 09/03/2024   AST 19 09/03/2024   ALKPHOS 31 (L) 09/03/2024   BILITOT 0.6 09/03/2024       "

## 2024-09-07 NOTE — Assessment & Plan Note (Signed)
 Thyroid  function is  low end of normal on current dose of 75 mcg. Advised to increase dose to 88 mcg daily    Lab Results  Component Value Date   TSH 3.54 09/03/2024

## 2024-09-07 NOTE — Assessment & Plan Note (Signed)
 Repeat LFts are nomal.    Lab Results  Component Value Date   ALT 17 09/03/2024   AST 19 09/03/2024   ALKPHOS 31 (L) 09/03/2024   BILITOT 0.6 09/03/2024

## 2024-12-04 ENCOUNTER — Other Ambulatory Visit

## 2025-03-06 ENCOUNTER — Ambulatory Visit: Admitting: Internal Medicine
